# Patient Record
Sex: Female | Born: 1970 | Race: Black or African American | Hispanic: No | Smoking: Never smoker
Health system: Southern US, Community
[De-identification: ages and names within clinical notes are randomized; demographics above are authoritative.]

## PROBLEM LIST (undated history)

## (undated) DIAGNOSIS — Z87898 Personal history of other specified conditions: Secondary | ICD-10-CM

## (undated) DIAGNOSIS — N83201 Unspecified ovarian cyst, right side: Secondary | ICD-10-CM

## (undated) DIAGNOSIS — Z973 Presence of spectacles and contact lenses: Secondary | ICD-10-CM

## (undated) DIAGNOSIS — R350 Frequency of micturition: Secondary | ICD-10-CM

## (undated) DIAGNOSIS — D219 Benign neoplasm of connective and other soft tissue, unspecified: Secondary | ICD-10-CM

## (undated) DIAGNOSIS — R55 Syncope and collapse: Secondary | ICD-10-CM

## (undated) DIAGNOSIS — I10 Essential (primary) hypertension: Secondary | ICD-10-CM

## (undated) HISTORY — PX: NO PAST SURGERIES: SHX2092

## (undated) HISTORY — DX: Syncope and collapse: R55

---

## 2005-08-20 ENCOUNTER — Emergency Department (HOSPITAL_COMMUNITY): Admission: EM | Admit: 2005-08-20 | Discharge: 2005-08-20 | Payer: Self-pay | Admitting: Emergency Medicine

## 2006-10-09 IMAGING — CT CT HEAD W/O CM
1 series · 16 of 30 positions shown, 20 images · IV contrast (agent unspecified)
Comparison: None.

CLINICAL DATA: Syncope and generalized headache.  
 HEAD CT WITHOUT CONTRAST:
TECHNIQUE: Contiguous axial images were obtained from the base of the skull through the vertex according to standard protocol without contrast.

[Series 2: headseq 4.8 h45s · axial · 0.40mm/px · z∈[-162,-31]mm · 16 of 30 slices shown, 20 images]
[im 2/30  brain]
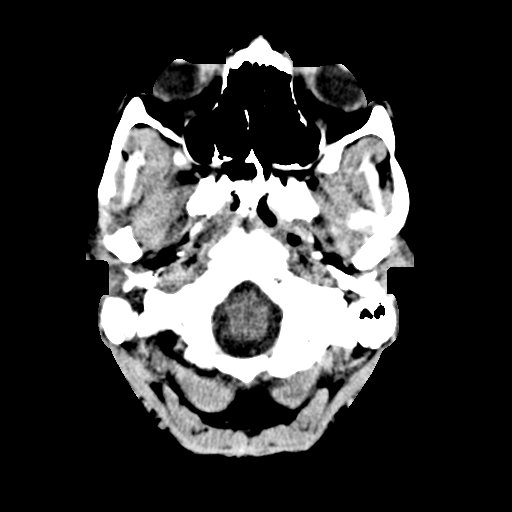
[im 2/30  bone]
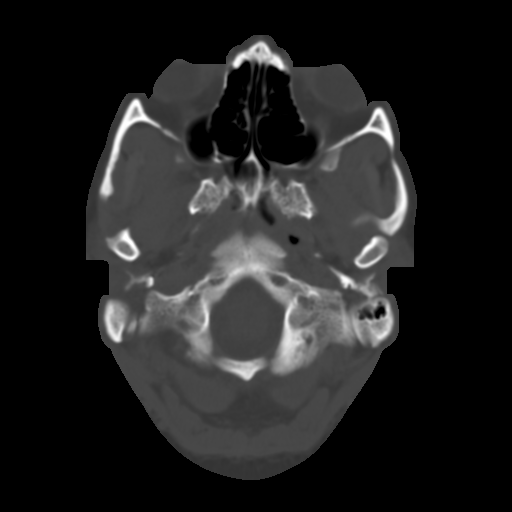
[im 4/30  brain]
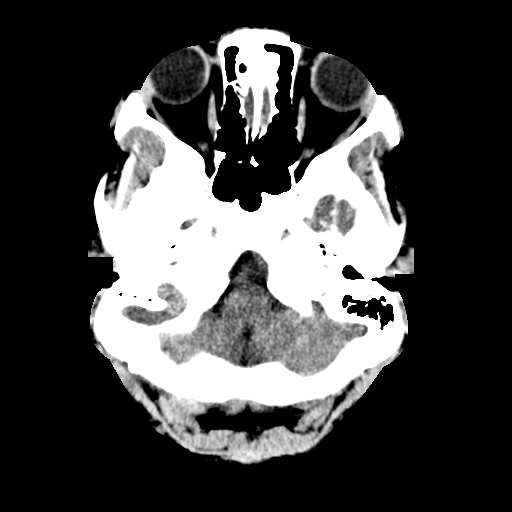
[im 6/30  brain]
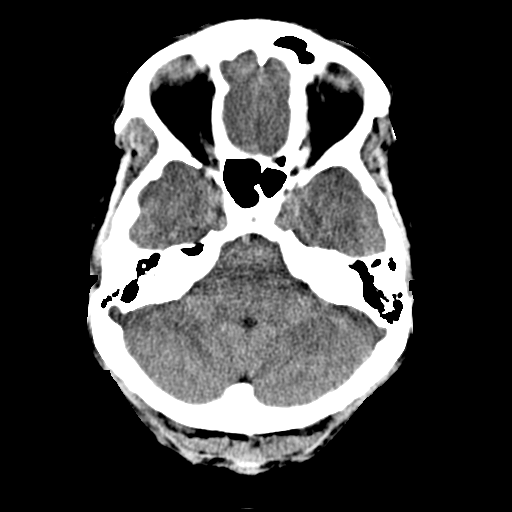
[im 8/30  brain]
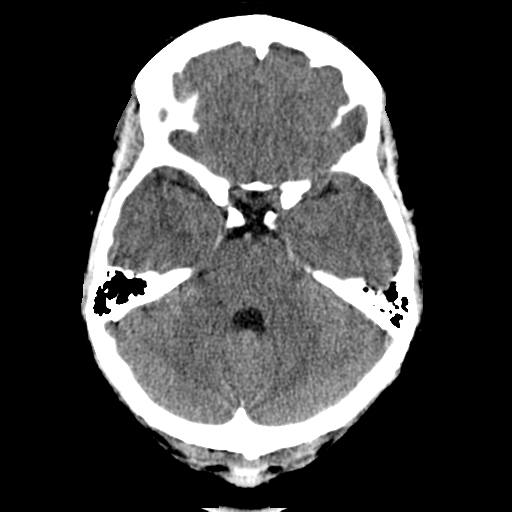
[im 9/30  brain]
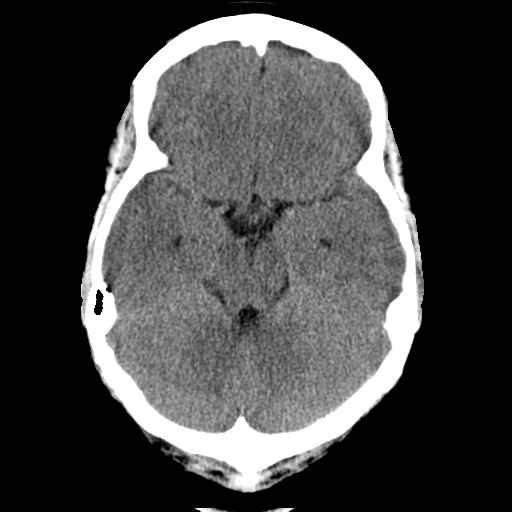
[im 9/30  bone]
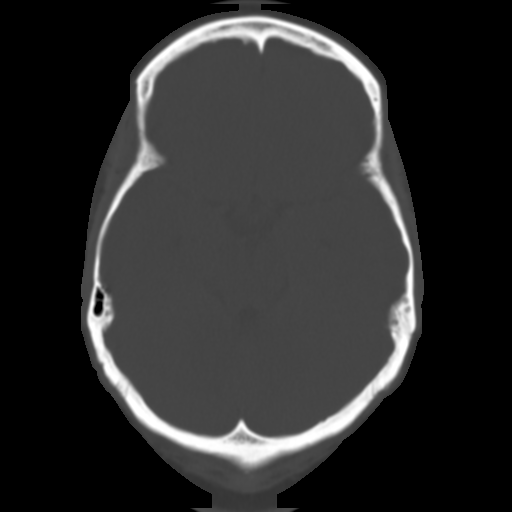
[im 11/30  brain]
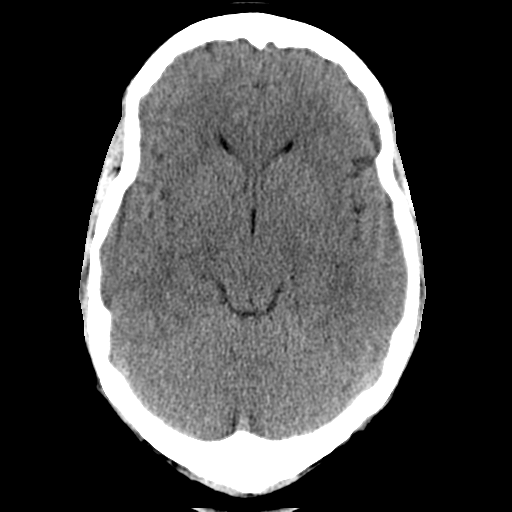
[im 13/30  brain]
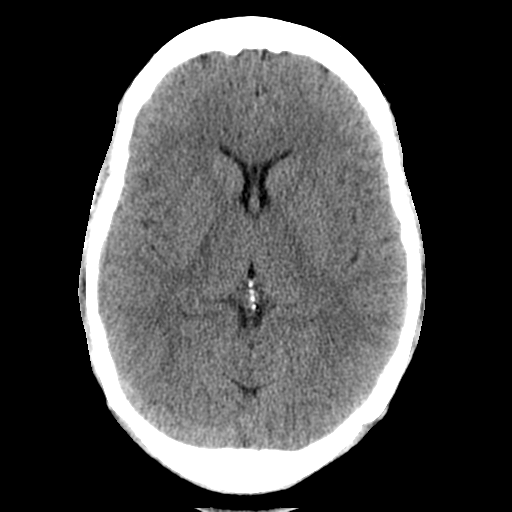
[im 15/30  brain]
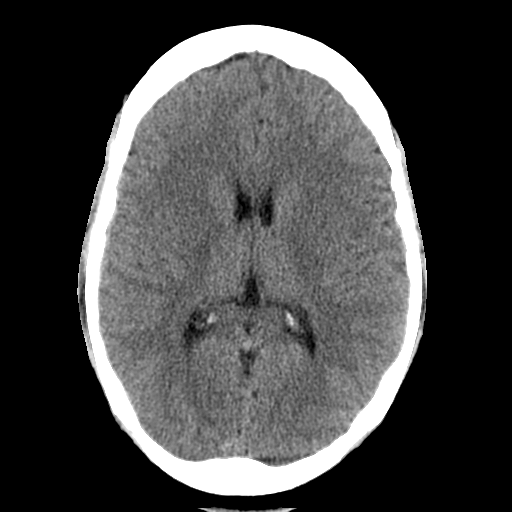
[im 16/30  brain]
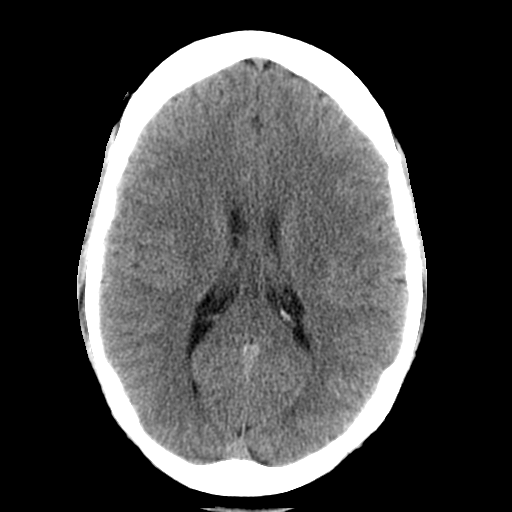
[im 16/30  bone]
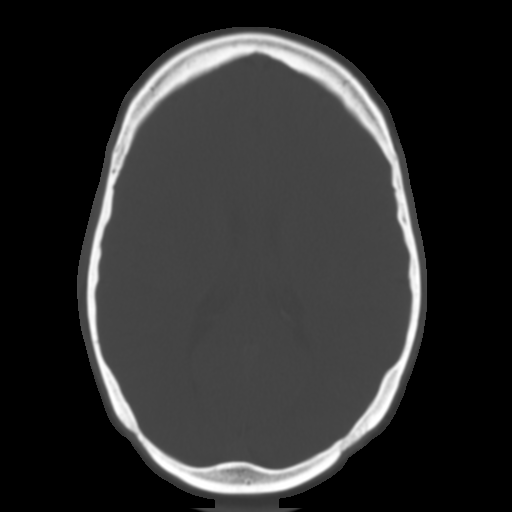
[im 18/30  brain]
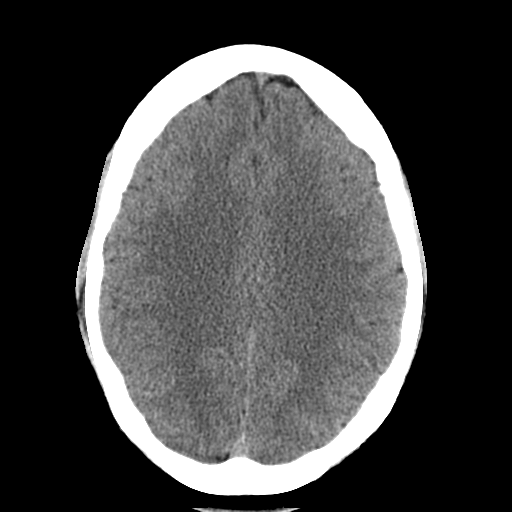
[im 20/30  brain]
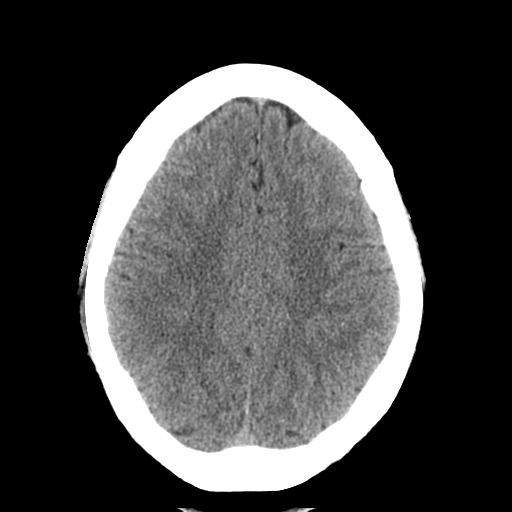
[im 22/30  brain]
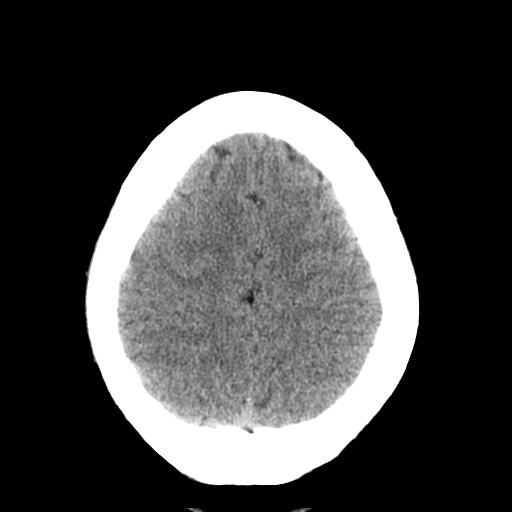
[im 23/30  brain]
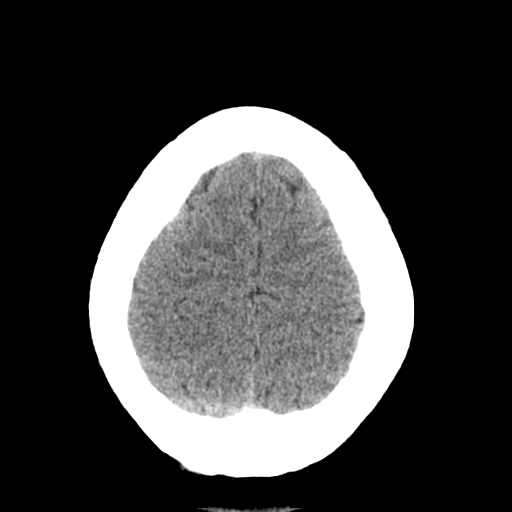
[im 23/30  bone]
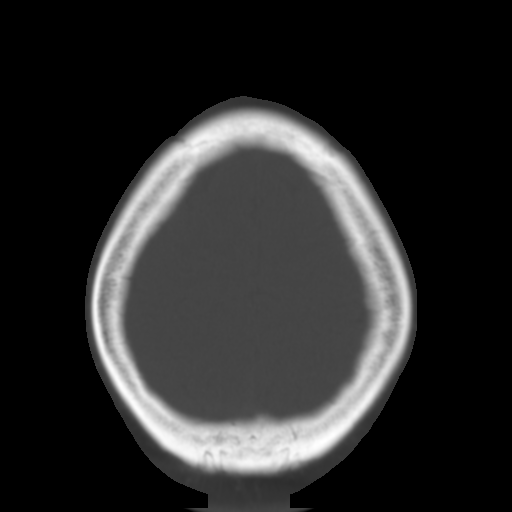
[im 25/30  brain]
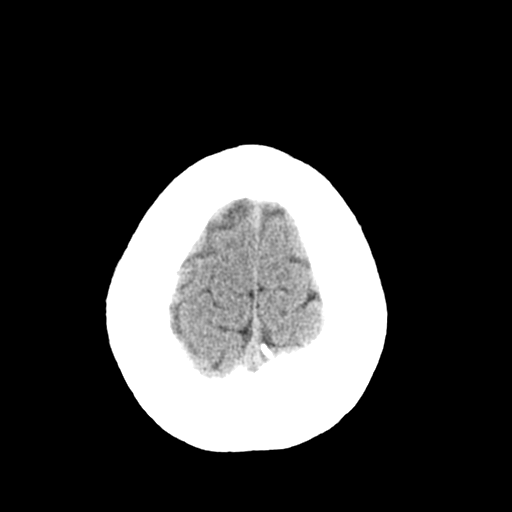
[im 27/30  brain]
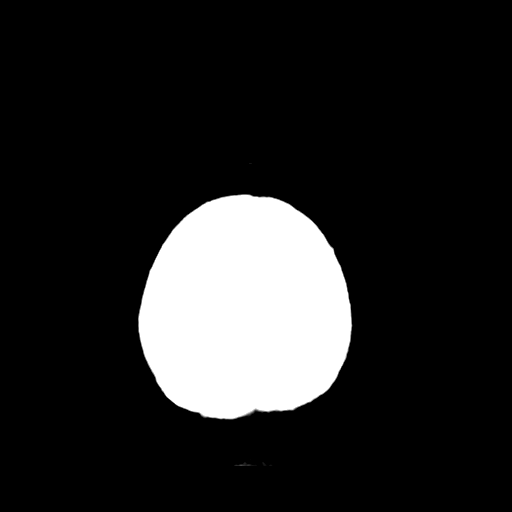
[im 29/30  brain]
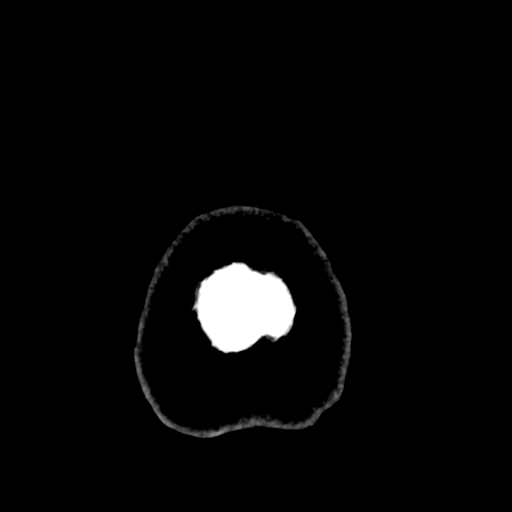

[16 of 30 positions shown; findings below may reference images not displayed]

FINDINGS: No CT evidence of acute infarct, hemorrhage, mass, mass effect, or hydrocephalus.  Visualized paranasal sinuses and mastoid air cells are clear.
IMPRESSION: No acute intracranial abnormality.

## 2013-10-24 ENCOUNTER — Emergency Department (HOSPITAL_COMMUNITY)
Admission: EM | Admit: 2013-10-24 | Discharge: 2013-10-24 | Disposition: A | Discharge status: Home / Self Care | Payer: Self-pay | Attending: Emergency Medicine | Admitting: Emergency Medicine

## 2013-10-24 DIAGNOSIS — I495 Sick sinus syndrome: Secondary | ICD-10-CM | POA: Insufficient documentation

## 2013-10-24 DIAGNOSIS — R42 Dizziness and giddiness: Secondary | ICD-10-CM | POA: Insufficient documentation

## 2013-10-24 DIAGNOSIS — R000 Tachycardia, unspecified: Secondary | ICD-10-CM

## 2013-10-24 DIAGNOSIS — R55 Syncope and collapse: Secondary | ICD-10-CM | POA: Insufficient documentation

## 2013-10-24 LAB — LIPASE, BLOOD: LIPASE: 36 U/L (ref 11–59)

## 2013-10-24 LAB — T4, FREE: Free T4: 1.34 ng/dL (ref 0.80–1.80)

## 2013-10-24 LAB — CBC WITH DIFFERENTIAL/PLATELET
BASOS ABS: 0 10*3/uL (ref 0.0–0.1)
BASOS PCT: 0 % (ref 0–1)
EOS ABS: 0.6 10*3/uL (ref 0.0–0.7)
Eosinophils Relative: 8 % — ABNORMAL HIGH (ref 0–5)
HEMATOCRIT: 38.7 % (ref 36.0–46.0)
Hemoglobin: 13.4 g/dL (ref 12.0–15.0)
Lymphocytes Relative: 36 % (ref 12–46)
Lymphs Abs: 2.5 10*3/uL (ref 0.7–4.0)
MCH: 29.2 pg (ref 26.0–34.0)
MCHC: 34.6 g/dL (ref 30.0–36.0)
MCV: 84.3 fL (ref 78.0–100.0)
MONO ABS: 0.4 10*3/uL (ref 0.1–1.0)
Monocytes Relative: 6 % (ref 3–12)
NEUTROS PCT: 50 % (ref 43–77)
Neutro Abs: 3.4 10*3/uL (ref 1.7–7.7)
Platelets: 144 10*3/uL — ABNORMAL LOW (ref 150–400)
RBC: 4.59 MIL/uL (ref 3.87–5.11)
RDW: 13.6 % (ref 11.5–15.5)
WBC: 6.9 10*3/uL (ref 4.0–10.5)

## 2013-10-24 LAB — COMPREHENSIVE METABOLIC PANEL
ALT: 11 U/L (ref 0–35)
AST: 19 U/L (ref 0–37)
Albumin: 4.1 g/dL (ref 3.5–5.2)
Alkaline Phosphatase: 51 U/L (ref 39–117)
BILIRUBIN TOTAL: 0.4 mg/dL (ref 0.3–1.2)
BUN: 6 mg/dL (ref 6–23)
CALCIUM: 9.2 mg/dL (ref 8.4–10.5)
CHLORIDE: 103 meq/L (ref 96–112)
CO2: 20 meq/L (ref 19–32)
Creatinine, Ser: 0.76 mg/dL (ref 0.50–1.10)
GFR calc Af Amer: >90 mL/min (ref >90)
Glucose, Bld: 129 mg/dL — ABNORMAL HIGH (ref 70–99)
POTASSIUM: 3.6 meq/L — AB (ref 3.7–5.3)
Sodium: 139 mEq/L (ref 137–147)
TOTAL PROTEIN: 8 g/dL (ref 6.0–8.3)

## 2013-10-24 LAB — RAPID URINE DRUG SCREEN, HOSP PERFORMED
AMPHETAMINES: NOT DETECTED
BENZODIAZEPINES: NOT DETECTED
Barbiturates: NOT DETECTED
Cocaine: NOT DETECTED
Opiates: NOT DETECTED
Tetrahydrocannabinol: NOT DETECTED

## 2013-10-24 LAB — TSH: TSH: 1.25 u[IU]/mL (ref 0.350–4.500)

## 2013-10-24 LAB — I-STAT TROPONIN, ED: Troponin i, poc: 0 ng/mL (ref 0.00–0.08)

## 2013-10-24 LAB — D-DIMER, QUANTITATIVE (NOT AT ARMC): D DIMER QUANT: 0.43 ug{FEU}/mL (ref 0.00–0.48)

## 2013-10-24 MED ORDER — SODIUM CHLORIDE 0.9 % IV BOLUS (SEPSIS)
1000.0000 mL | Freq: Once | INTRAVENOUS | Status: AC
  Administered 2013-10-24: 1000 mL via INTRAVENOUS

## 2013-10-24 MED ORDER — METOPROLOL TARTRATE 25 MG PO TABS
25.0000 mg | ORAL_TABLET | Freq: Once | ORAL | Status: AC
  Administered 2013-10-24: 25 mg via ORAL
  Filled 2013-10-24: qty 1

## 2013-10-24 MED ORDER — METOPROLOL TARTRATE 25 MG PO TABS: 25.0000 mg | ORAL_TABLET | Freq: Two times a day (BID) | ORAL | Status: DC

## 2013-10-24 NOTE — ED Notes (Signed)
Patient c/o new onset of feeling hot, lightheaded, stomach pain. MD Mingo Amber made aware.

## 2013-10-24 NOTE — ED Provider Notes (Signed)
CSN: [id number]     Arrival date & time 10/24/13  1601 History   First MD Initiated Contact with Patient 10/24/13 1620     Chief Complaint  Patient presents with  . Palpitations     (Consider location/radiation/quality/duration/timing/severity/associated sxs/prior Treatment) Patient is a 43 y.o. female presenting with palpitations. The history is provided by the patient.  Palpitations Palpitations quality:  Regular Onset quality:  Sudden Timing:  Sporadic Progression:  Resolved Chronicity:  New Context: caffeine (half of a coca-cola this morning)   Context: not anxiety, not appetite suppressants, not exercise, not hyperventilation, not illicit drugs, not nicotine and not stimulant use   Relieved by:  Bed rest Worsened by:  Nothing tried Associated symptoms: dizziness and near-syncope   Associated symptoms: no chest pain, no cough, no nausea, no numbness, no shortness of breath, no syncope and no vomiting     No past medical history on file.No medical problems No past surgical history on file.No prior surgeries  No family history on file. History  Substance Use Topics  . Smoking status: Not on file  . Smokeless tobacco: Not on file  . Alcohol Use: Not on file   OB History   No data available     Review of Systems  Constitutional: Negative for fever and chills.  Respiratory: Negative for cough and shortness of breath.   Cardiovascular: Positive for palpitations and near-syncope. Negative for chest pain, leg swelling and syncope.  Gastrointestinal: Positive for diarrhea (last week). Negative for nausea and vomiting.  Neurological: Positive for dizziness. Negative for numbness.  All other systems reviewed and are negative.      Allergies  Codeine  Home Medications   Current Outpatient Rx  Name  Route  Sig  Dispense  Refill  . Biotin 1000 MCG tablet   Oral   Take 1,000 mcg by mouth 3 (three) times daily.         . cetirizine (ZYRTEC) 10 MG tablet   Oral  Take 5 mg by mouth daily. 1/2 tablet         . Multiple Vitamins-Minerals (MULTIVITAMIN WITH MINERALS) tablet   Oral   Take 1 tablet by mouth daily.         . Sodium & Potassium Bicarbonate (ALKA-AID) 360-180 MG TABS   Oral   Take 1 tablet by mouth daily as needed (flu-like symptoms).          BP 152/72  Pulse 108  Temp(Src) 98.8 F (37.1 C) (Oral)  Resp 16 Physical Exam  Nursing note and vitals reviewed. Constitutional: She is oriented to person, place, and time. She appears well-developed and well-nourished. No distress.  HENT:  Head: Normocephalic and atraumatic.  Eyes: EOM are normal. Pupils are equal, round, and reactive to light.  Neck: Normal range of motion. Neck supple.  Cardiovascular: An irregular rhythm present. Tachycardia present.  Exam reveals no friction rub.   No murmur heard. Pulmonary/Chest: Effort normal and breath sounds normal. No respiratory distress. She has no wheezes. She has no rales.  Abdominal: Soft. She exhibits no distension. There is no tenderness. There is no rebound.  Musculoskeletal: Normal range of motion. She exhibits no edema.  Neurological: She is alert and oriented to person, place, and time.  Skin: She is not diaphoretic.    ED Course  Procedures (including critical care time) Labs Review Labs Reviewed  CBC WITH DIFFERENTIAL - Abnormal; Notable for the following:    Platelets 144 (*)    Eosinophils Relative 8 (*)  All other components within normal limits  COMPREHENSIVE METABOLIC PANEL  LIPASE, BLOOD  I-STAT TROPOININ, ED   Imaging Review No results found.   EKG Interpretation   Date/Time:  Monday October 24 2013 16:12:05 EDT Ventricular Rate:  116 PR Interval:  156 QRS Duration: 103 QT Interval:  319 QTC Calculation: 443 R Axis:   47 Text Interpretation:  Sinus tachycardia with irregular rate Probable left  atrial enlargement RSR' in V1 or V2, right VCD or RVH Morpholgy similar,  tachycardia new Confirmed by  Mingo Amber  MD, Ramiel Forti ([id number]) on 10/24/2013 4:20:54  PM      MDM   Final diagnoses:  Sinus tachycardia    43 year old female presents with palpitations. Was checking her e-mail and began for her heart racing. Felt associated dizziness. No nausea or chest pain. No fever or shortness of breath. Recent GI illness last week. She had caffeine this morning, half of a Coke. Not a normal caffeine user. No illicit drugs. No drug withdrawal or new drugs that she's been taking. In the room, she has sinus rhythm on the monitor with tachycardia noted. She has episodes of her heart rate going from the 90s up to the 120s at times. When asked if she felt a palpitation at that time, she does not report she feels it. No dizziness at this time. We'll check labs. EKG with sinus tachycardia. Will also give fluids Labs normal. Still having episodes of paroxysmal tachycardia. I spoke with Cards who stated this could be an ectopic atrial foci. Given Metoprolol BID per Cards and instructed on vagal maneuvers to help break it. Given resource guide for f/u.   Osvaldo Shipper, MD 10/25/13 [phone]

## 2013-10-24 NOTE — ED Notes (Signed)
Per EMS patient with Hx of stomach virus last week and recent new stress began feeling racing heart and generalized weakness starting 30 minutes ago, tachycardia at 130-150 bpm. Denies CP, SOB, n/v.

## 2013-10-24 NOTE — Discharge Instructions (Signed)
Cardiac Arrhythmia  Your heart is a muscle that works to pump blood through your body by regular contractions. The beating of your heart is controlled by a system of special pacemaker cells. These cells control the electrical activity of the heart. When the system controlling this regular beating is disturbed, a heart rhythm abnormality (arrhythmia) results.  WHEN YOUR HEART SKIPS A BEAT  One of the most common and least serious heart arrhythmias is called an ectopic or premature atrial heartbeat (PAC). This may be noticed as a small change in your regular pulse. A PAC originates from the top part (atrium) of the heart. Within the right atrium, the SA node is the area that normally controls the regularity of the heart. PACs occur in heart tissue outside of the SA node region. You may feel this as a skipped beat or heart flutter, especially if several occur in succession or occur frequently.   Another arrhythmia is ventricular premature complex (VCP or PVC). These extra beats start out in the bottom, more muscular chambers of the heart. In most cases a PVC is harmless. If there are underlying causes that are making the heart irritable such as an overactive thyroid or a prior heart attack PVCs may be of more concern. In a few cases, medications to control the heart rhythm may be prescribed.  Things to try at home:   Cut down or avoid alcohol, tobacco and caffeine.   Get enough sleep.   Reduce stress.   Exercise more.  WHEN THE HEART BEATS TOO FAST  Atrial tachycardia is a fast heart rate, which starts out in the atrium. It may last from minutes to much longer. Your heart may beat 140 to 240 times per minute instead of the normal 60 to 100.   Symptoms include a worried feeling (anxiety) and a sense that your heart is beating fast and hard.   You may be able to stop the fast rate by holding your breath or bearing down as if you were going to have a bowel movement.   This type of fast rate is usually not  dangerous.  Atrial fibrillation and atrial flutter are other fast rhythms that start in the atria. Both conditions keep the atria from filling with enough blood so the heart does not work well.   Symptoms include feeling light-headed or faint.   These fast rates may be the result of heart damage or disease. Too much thyroid hormone may play a role.   There may be no clear cause or it may be from heart disease or damage.   Medication or a special electrical treatment (cardioversion) may be needed to get the heart beating normally.  Ventricular tachycardia is a fast heart rate that starts in the lower muscular chambers (ventricles) This is a serious disorder that requires treatment as soon as possible. You need someone else to get and use a small defibrillator.   Symptoms include collapse, chest pain, or being short of breath.   Treatment may include medication, procedures to improve blood flow to the heart, or an implantable cardiac defibrillator (ICD).  DIAGNOSIS    A cardiogram (EKG or ECG) will be done to see the arrhythmia, as well as lab tests to check the underlying cause.   If the extra beats or fast rate come and go, you may wear a Holter monitor that records your heart rate for a longer period of time.  SEEK MEDICAL CARE IF:   You have irregular or fast heartbeats (palpitations).     you are already being treated. SEEK IMMEDIATE MEDICAL CARE IF:   You have severe chest pain, especially if the pain is crushing or pressure-like and spreads to the arms, back, neck, or jaw, or if you have sweating, feeling sick to your stomach (nausea), or shortness of breath. THIS IS AN EMERGENCY. Do not wait to see if the pain will go away. Get medical help at once. Call 911 or 0 (operator). DO NOT drive yourself to the hospital.  You feel dizzy or  faint.  You have episodes of previously documented atrial tachycardia that do not resolve with the techniques your caregiver has taught you.  Irregular or rapid heartbeats begin to occur more often than in the past, especially if they are associated with more pronounced symptoms or of longer duration. Document Released: 07/07/2005 Document Revised: 09/29/2011 Document Reviewed: 02/23/2008 [facility] Patient Information 2014 [place].   Emergency Department Resource Guide 1) Find a Doctor and Pay Out of Pocket Although you won't have to find out who is covered by your insurance plan, it is a good idea to ask around and get recommendations. You will then need to call the office and see if the doctor you have chosen will accept you as a new patient and what types of options they offer for patients who are self-pay. Some doctors offer discounts or will set up payment plans for their patients who do not have insurance, but you will need to ask so you aren't surprised when you get to your appointment.  2) Contact Your Local Health Department Not all health departments have doctors that can see patients for sick visits, but many do, so it is worth a call to see if yours does. If you don't know where your local health department is, you can check in your phone book. The CDC also has a tool to help you locate your state's health department, and many state websites also have listings of all of their local health departments.  3) Find a [facility] If your illness is not likely to be very severe or complicated, you may want to try a walk in clinic. These are popping up all over the country in pharmacies, drugstores, and shopping centers. They're usually staffed by nurse practitioners or physician assistants that have been trained to treat common illnesses and complaints. They're usually fairly quick and inexpensive. However, if you have serious medical issues or chronic medical problems, these are  probably not your best option.  No Primary Care Doctor: - Call Health Connect at  [phone] - they can help you locate a primary care doctor that  accepts your insurance, provides certain services, etc. - Physician Referral Service- [phone]  Chronic Pain Problems: Organization         Address  Phone   Notes  [facility]  [phone] Patients need to be referred by their primary care doctor.   Medication Assistance: Organization         Address  Phone   Notes  [facility] [address] [place], [place] [address] [phone] --Must be a resident of [facility] -- Must have NO insurance coverage whatsoever (no Medicaid/ Medicare, etc.) -- The pt. MUST have a primary care doctor that directs their care regularly and follows them in the community   [facility]  [phone]   [employer]  [phone]    Agencies that provide inexpensive medical care: Organization  Address  Phone   Notes  [facility]  [phone]   [facility]    [phone]   [facility] [place], [place] [address] [phone]   [place] [address], [place] [phone]   Planned Parenthood    [phone]   [facility]    [phone]   [place] and [place] [address], [place] Phone:  [phone], Fax:  [phone] Hours of Operation:  9 am - 6 pm, M-F.  Also accepts Medicaid/Medicare and self-pay.  [facility] for [address], [address], [place] Phone: [phone], Fax: [phone]. Hours of Operation:  8:30 am - 5:30 pm, M-F.  Also accepts Medicaid and self-pay.  [facility] [place] [address] [place] Phone: [phone]   [place], [place], [place] [phone], Ext. [phone] Mondays & Thursdays:  7-9 AM.  First 15 patients are seen on a first come, first serve basis.    [place] Providers:  Organization         Address  Phone   Notes  [facility] [address], [place] [phone] Also accepts self-pay patients.  [facility] [address], [place]  [phone]   [place], [address], [place] [phone]   [facility] [address], [place] [phone]   Lucianne Lei [address], [place]   [phone] Only accepts [place] Access [place] patients after they have their name applied to their card.   Self-Pay (no insurance) in [facility]:  Organization         Address  Phone   Notes  Sickle Cell Patients, [facility] [place] [phone]   [facility] [place] [phone]   [facility] [place]  [place], [place], [place] [phone]   [facility]/Dr. Osei-Bonsu  [address] [place] or [address], [place] [phone] Phone number for both [place] and [place] locations is the same.  [facility]gent [facility] and [facility] [address], [place] [phone]   [facility] [address], [place] or [address] [phone]   [facility] [address] [place] [phone], phone; [phone], fax Sees patients 1st and 3rd Saturday of every month.  Must not qualify for public or private insurance (i.e. Medicaid, Medicare, [place] Health Choice, Veterans' Benefits)  Household income should be no more than 200% of the poverty level The clinic cannot treat you if you are pregnant or think you are pregnant  Sexually transmitted diseases are not treated at the clinic.     Dental Care: Organization         Address  Phone  Notes  [facility] Department of [facility] [phone] Accepts children up to age 66 who are enrolled in [place] or [place]; pregnant women with a Medicaid card; and children who have applied for Medicaid or [place] Health Choice, but were declined, whose parents can pay a reduced fee at time  of service.  [facility] Department of [facility]  [address], [place] [phone] Accepts children up to age 36 who are enrolled in [place] or [place]; pregnant women with a Medicaid card; and children who have applied for Medicaid or [place] Health Choice, but were declined, whose parents can pay a reduced fee at time of service.  [place] Adult [facility] Access PRO[facility]  [place] [phone] Patients are seen by appointment only. Walk-ins are not accepted. [place] will see patients 58 years of age and older. Monday - Tuesday (8am-5pm) Most Wednesdays (8:30-5pm) $30 per visit, cash only  [facility] Adult Dental Access PROGRAM  [address], [facility] [phone] Patients are seen by appointment only. Walk-ins are not accepted. [facility] will see patients 78 years of age and older. One Wednesday Evening (Monthly: Volunteer Based).  $30 per visit, cash only  [place]  [phone] for adults; Children under age 20, call Graduate Pediatric Dentistry at [phone]. Children aged 64-14, please call [phone] to request a pediatric application.  Dental services are provided in all areas of dental care including fillings, crowns and bridges, complete and partial dentures, implants, gum treatment, root canals, and extractions. Preventive care is also provided. Treatment is provided to both adults and children. Patients are selected via a lottery and there is often a waiting  list.   [facility] [address] [place]  [phone] [email or web address]   [facility] [address] [place], [place] [phone], Ext. [phone] Second and Fourth Thursday of each month, opens at 6:30 AM; Clinic ends at 9 AM.  Patients are seen on a first-come first-served basis, and a limited number are seen during each clinic.   [facility]  [address] [place] Danker [place], [place] [phone]   Eligibility Requirements You must have lived in [place], [place], or [place] counties for at least the last three months.   You cannot be eligible for state or federal sponsored [employer], including [employer], [place], or [employer].   You generally cannot be eligible for healthcare insurance through your employer.    How to apply: Eligibility screenings are held every Tuesday and Wednesday afternoon from 1:00 pm until 4:00 pm. You do not need an appointment for the interview!  [facility] [address], [place], [place]   [place]  [place] Department  [place]  [phone]    Behavioral Health Resources in the Community: Intensive Outpatient Programs Organization         Address  Phone  Notes  [facility] [address] [place], [place] [phone]   [facility] Outpatient [address] [place], [place]   ADS: [facility] [address] [place], [place]   [place] [address],  [place], [place] or [phone]   Substance Abuse Resources Organization         Address  Phone  Notes  [facility]  [phone]   [place]  [phone]   [facility] [place]   Chinita Pester  [phone]   Residential & Outpatient Substance Abuse Program   [phone]   Psychological Services Organization         Address  Phone  Notes  °[facility]  336- [phone]   °[facility]  336- [phone]   °[facility] [address], [place] 1-800-853-5163 or [phone]   ° °Mobile Crisis Teams °Organization         Address  Phone  Notes  °[facility], Mobile Crisis Care Unit  1-877-626-1772   °Assertive °Psychotherapeutic Services ° [address] [place], [place] [phone]   °Sharon DeEsch [address] °[place] [phone]   ° °Self-Help/Support Groups °Organization         Address  Phone             Notes  °[facility] [place] - variety of support groups  336- 373-1402 Call for more information  °Narcotics Anonymous (NA), [facility] [address], °[place]  2 meetings at this location  ° °Residential Treatment Programs °Organization         Address  Phone  Notes  °[facility] [address],    °[place]  1-866-801-8205   °[facility] ° [address], [place], [place] [phone]   °[facility] [address], [place] [phone] Admissions: 8am-3pm M-F  °[facility] [address]    °[place], [place] [phone]   °[facility] [address], [place], [place] [phone]   °[facility] [address]  °[place], [place] [phone]   °[facility] - Intensive Outpatient [address], [place], [place] [phone]   °[facility] [address]  °[place], [place] 1-877-615-2722 or [phone]   °[facility] ([facility]) [address] [place], [place] [phone] Accepts Medicaid  °[facility] [address]  °[place] 1-800-659-3381 Substance Abuse/Addiction Treatment  ° °[place] Behavioral Health Resources °Organization          Address  Phone  Notes  °[facility]  (888) [phone]   °Julie Brannon, PhD [address] [place], [place]   (336) 349-5553 or (336) 951-0000   °[facility]   [address] °[place], [place] (336) 349-4454   °[facility] [address], [place], [place] (336) [phone] Insurance/Medicaid/sponsorship through [facility]  °[facility] [address]                                    [place], [place] (336) [phone] Therapy/tele-psych/case  °[facility] [address]  ° [place], [place] (336) 349-2233    °Dr. Arfeen  (336) 349-4544   °[facility] [place]  [employer] [place] [facility] 1) [address], [place] °2) [address], [place] °3)  [address], [place] (336) 349-3220 °(336) 342-7768 ° °(336) 342-8140   °[place] Child Abuse Hotline (336) 342-1394 or (336) 342-3537 (After Hours)    ° ° ° °

## 2013-10-24 NOTE — ED Notes (Signed)
Bed: OZ22 Expected date:  Expected time:  Means of arrival:  Comments: ems- 43 yo, tachycardia

## 2017-08-25 ENCOUNTER — Encounter: Payer: Self-pay | Admitting: Gynecology

## 2017-08-25 ENCOUNTER — Ambulatory Visit (INDEPENDENT_AMBULATORY_CARE_PROVIDER_SITE_OTHER): Payer: BC Managed Care – PPO | Admitting: Gynecology

## 2017-08-25 VITALS — BP 130/76 | Ht 66.0 in | Wt 206.0 lb

## 2017-08-25 DIAGNOSIS — N926 Irregular menstruation, unspecified: Secondary | ICD-10-CM | POA: Diagnosis not present

## 2017-08-25 DIAGNOSIS — Z01411 Encounter for gynecological examination (general) (routine) with abnormal findings: Secondary | ICD-10-CM | POA: Diagnosis not present

## 2017-08-25 DIAGNOSIS — D259 Leiomyoma of uterus, unspecified: Secondary | ICD-10-CM | POA: Diagnosis not present

## 2017-08-25 DIAGNOSIS — Z1151 Encounter for screening for human papillomavirus (HPV): Secondary | ICD-10-CM | POA: Diagnosis not present

## 2017-08-25 NOTE — Addendum Note (Signed)
Addended by: Nelva Nay on: 08/25/2017 04:48 PM   Modules accepted: Orders

## 2017-08-25 NOTE — Patient Instructions (Signed)
Follow-up for the ultrasound as scheduled   Call to Schedule your mammogram  Facilities in [place]: 1)  [facility]. [facility], [address] Phone: [phone] 2)  Dr. Bertrand at [facility]  [address] Phone: [phone]     Mammogram A mammogram is an X-ray test to find changes in a woman's breast. You should get a mammogram if:  You are 45 years of age or older  You have risk factors.   Your doctor recommends that you have one.  BEFORE THE TEST  Do not schedule the test the week before your period, especially if your breasts are sore during this time.  On the day of your mammogram:  Wash your breasts and armpits well. After washing, do not put on any deodorant or talcum powder on until after your test.   Eat and drink as you usually do.   Take your medicines as usual.   If you are diabetic and take insulin, make sure you:   Eat before coming for your test.   Take your insulin as usual.   If you cannot keep your appointment, call before the appointment to cancel. Schedule another appointment.  TEST  You will need to undress from the waist up. You will put on a hospital gown.   Your breast will be put on the mammogram machine, and it will press firmly on your breast with a piece of plastic called a compression paddle. This will make your breast flatter so that the machine can X-ray all parts of your breast.   Both breasts will be X-rayed. Each breast will be X-rayed from above and from the side. An X-ray might need to be taken again if the picture is not good enough.   The mammogram will last about 15 to 30 minutes.  AFTER THE TEST Finding out the results of your test Ask when your test results will be ready. Make sure you get your test results.  Document Released: 10/03/2008 Document Revised: 06/26/2011 Document Reviewed: 10/03/2008 [employer] Patient Information 2012 [employer].    

## 2017-08-25 NOTE — Progress Notes (Signed)
Shelly Carlson [birth year] [id number]        47 y.o.  G0P0000 new patient for annual gynecologic exam.  The patient notes that her menses have always been regular up until approximately 2 years ago where she went almost a year without bleeding.  Over the last year she had monthly menses until most recently when she started bleeding now which is 2 weeks early for her.  Notes some weight gain over the past year or 2.  No skin or hair changes.  No nipple discharge.  No hot flushes or night sweats.  Not using contraception although not actively trying to achieve pregnancy not protecting against it.  No history of abnormal Pap smears although it has been 6 years.  Has never had a mammogram.  Has a primary physician who does her routine blood work.  Past medical history,surgical history, problem list, medications, allergies, family history and social history were all reviewed and documented as reviewed in the EPIC chart.  ROS:  Performed with pertinent positives and negatives included in the history, assessment and plan.   Additional significant findings : None   Exam: [name] assistant Vitals:   08/25/17 1608  BP: 130/76  Weight: 206 lb (93.4 kg)  Height: 5\' 6"  (1.676 m)   Body mass index is 33.25 kg/m.  General appearance:  Normal affect, orientation and appearance. Skin: Grossly normal HEENT: Without gross lesions.  No cervical or supraclavicular adenopathy. Thyroid normal.  Lungs:  Clear without wheezing, rales or rhonchi Cardiac: RR, without RMG Abdominal:  Soft, nontender, without masses, guarding, rebound, organomegaly or hernia Breasts:  Examined lying and sitting without masses, retractions, discharge or axillary adenopathy. Pelvic:  Ext, BUS, Vagina: Normal with light menses flow  Cervix: Normal with light menses flow.  Pap smear done  Uterus: Bulky 16-18 weeks size midline mobile nontender  Adnexa: Unable to evaluate secondary to the bulk of the uterus  Anus and  perineum: Normal   Rectovaginal: Normal sphincter tone without palpated masses or tenderness.    Assessment/Plan:  47 y.o. G0P0000 female for annual gynecologic exam with irregular menses, no contraception.   1. Irregular menses/enlarged uterus consistent with leiomyoma.  Patient has never been told she had fibroids in the past.  Uterus is markedly enlarged.  Also with irregular menses although over the past year have been more regular.  Will check baseline labs to include CBC, FSH TSH prolactin and hCG.  Recommend sonohysterogram to better define uterus and rule out ovarian process as well as endometrial defects.  Will allow for endometrial sampling if needed.  Patient will schedule in follow-up for this.  We discussed various scenarios to include hormonal/medication treatments up to and including surgery such as myomectomy or hysterectomy.  Patient will follow-up for her ultrasound then will go from there. 2. Contraception.  Patient not interested in contraception.  Would accept pregnancy if recurs. 3. Never had mammogram.  Most common cancer in women reviewed.  Need to schedule baseline screening mammogram stressed and she agrees to call and schedule.  Names and numbers provided.  Breast exam normal today. 4. Pap smear 6 years ago.  Pap smear/HPV today.  No history of abnormal Pap smears previously. 5. Health maintenance.  No routine lab work done as patient does this elsewhere.  Follow-up for lab results and sonohysterogram.  Additional time in excess of her routine gynecologic exam was spent in direct face to face counseling and coordination of care in regards to her irregular menses and  enlarged uterus.    Anastasio Auerbach MD, 4:32 PM 08/25/2017

## 2017-08-26 ENCOUNTER — Other Ambulatory Visit: Payer: Self-pay | Admitting: Gynecology

## 2017-08-26 DIAGNOSIS — N939 Abnormal uterine and vaginal bleeding, unspecified: Secondary | ICD-10-CM

## 2017-08-26 DIAGNOSIS — N852 Hypertrophy of uterus: Secondary | ICD-10-CM

## 2017-08-28 LAB — PAP IG AND HPV HIGH-RISK: HPV DNA High Risk: NOT DETECTED

## 2017-08-31 ENCOUNTER — Ambulatory Visit (INDEPENDENT_AMBULATORY_CARE_PROVIDER_SITE_OTHER): Payer: BC Managed Care – PPO

## 2017-08-31 ENCOUNTER — Encounter: Payer: Self-pay | Admitting: Gynecology

## 2017-08-31 ENCOUNTER — Other Ambulatory Visit: Payer: Self-pay | Admitting: Gynecology

## 2017-08-31 ENCOUNTER — Ambulatory Visit (INDEPENDENT_AMBULATORY_CARE_PROVIDER_SITE_OTHER): Payer: BC Managed Care – PPO | Admitting: Gynecology

## 2017-08-31 VITALS — BP 122/78

## 2017-08-31 DIAGNOSIS — N852 Hypertrophy of uterus: Secondary | ICD-10-CM | POA: Diagnosis not present

## 2017-08-31 DIAGNOSIS — D259 Leiomyoma of uterus, unspecified: Secondary | ICD-10-CM

## 2017-08-31 DIAGNOSIS — N939 Abnormal uterine and vaginal bleeding, unspecified: Secondary | ICD-10-CM | POA: Diagnosis not present

## 2017-08-31 DIAGNOSIS — N838 Other noninflammatory disorders of ovary, fallopian tube and broad ligament: Secondary | ICD-10-CM

## 2017-08-31 DIAGNOSIS — N926 Irregular menstruation, unspecified: Secondary | ICD-10-CM

## 2017-08-31 DIAGNOSIS — N839 Noninflammatory disorder of ovary, fallopian tube and broad ligament, unspecified: Secondary | ICD-10-CM | POA: Diagnosis not present

## 2017-08-31 DIAGNOSIS — N83201 Unspecified ovarian cyst, right side: Secondary | ICD-10-CM

## 2017-08-31 LAB — PREGNANCY, URINE: PREG TEST UR: NEGATIVE

## 2017-08-31 NOTE — Progress Notes (Addendum)
    Shelly Carlson [birth year] [id number]        46 y.o.  G0P0000 presents for sonohysterogram.  History of irregular menses and enlarged uterus on exam.  UPT negative today  Past medical history,surgical history, problem list, medications, allergies, family history and social history were all reviewed and documented in the EPIC chart.  Directed ROS with pertinent positives and negatives documented in the history of present illness/assessment and plan.  Exam: [place] assistant Blood pressure 122/78 General appearance:  Normal Abdomen soft nontender with palpable mass below the umbilicus. Pelvic external BUS vagina normal.  Cervix normal.  Uterus enlarged to 16-18 weeks size.  Adnexa unable to evaluate.  Ultrasound transvaginal and transabdominal shows uterus enlarged with large single anterior myoma 12 x 7.5 x 11.5 cm.  Smaller posterior myoma 28 x 25 mm.  Endometrial echo 5.5 mm.  Right ovary with thin-walled cyst with diffuse low-level internal echoes 63 x 64 x 53 mm.  60 mm mean.  Thin septum noted.  Negative color flow Doppler.  Left ovary not visualized.  Cul-de-sac negative.  Sonohysterogram performed, sterile technique with catheter introduced into lower uterine segment subsequently obstructed by the leiomyoma and unable to negotiate into the endometrial cavity.  Tried a separate [place] but again the myoma was obstructing the cavity.  Endometrial cavity it did distend with no abnormality seen.  Sample was taken from the lower uterine segment.  Patient tolerated well.  Assessment/Plan:  47 y.o. G0P0000 with ultrasound showing large single myoma with probable smaller ones accounting for uterine enlargement.  Endometrial echo relatively thin although could not distend mid to upper endometrial cavity with sonohysterogram due to obstruction from the large myoma.  Sample from the lower uterine segment was taken and will follow-up for these results.  Had ordered blood work previously but the  patient apparently did not go by the lab to have her baseline hormone studies done.  We will go ahead with TSH FSH prolactin today.  Has 6 cm right ovarian cyst with echogenic low-level echoes.  Physiologic/hemorrhagic versus pathologic differential up to and including ovarian cancer were reviewed with the patient.  Will check baseline CA 125.  Issues of false positive versus false negative discussed.  Ultimate management options reviewed to include if normal CA 125 expectant management with follow-up ultrasound in 2-3 months versus interventional depending on what we do with her leiomyoma also reviewed.  Options for myoma management discussed to include expectant, medication trials such as progesterone/Depo-Lupron, uterine artery embolization, myomectomy, hysterectomy.  The issues of fertility and her desires for the future also discussed.  Patient wants to think of all of these issues and will follow-up for lab test results and then will go from there.    Anastasio Auerbach MD, 9:07 AM 08/31/2017

## 2017-08-31 NOTE — Patient Instructions (Signed)
Follow-up for lab test results and ultimate decision about management

## 2017-09-01 LAB — CA 125: CA 125: 38 U/mL — AB (ref <35)

## 2017-09-01 LAB — PROLACTIN: Prolactin: 14.6 ng/mL

## 2017-09-01 LAB — FOLLICLE STIMULATING HORMONE: FSH: 10.2 m[IU]/mL

## 2017-09-01 LAB — TSH: TSH: 2.18 m[IU]/L

## 2017-09-18 ENCOUNTER — Ambulatory Visit (INDEPENDENT_AMBULATORY_CARE_PROVIDER_SITE_OTHER): Payer: BC Managed Care – PPO | Admitting: Gynecology

## 2017-09-18 ENCOUNTER — Encounter: Payer: Self-pay | Admitting: Gynecology

## 2017-09-18 VITALS — BP 120/76

## 2017-09-18 DIAGNOSIS — D259 Leiomyoma of uterus, unspecified: Secondary | ICD-10-CM | POA: Diagnosis not present

## 2017-09-18 DIAGNOSIS — N83201 Unspecified ovarian cyst, right side: Secondary | ICD-10-CM | POA: Diagnosis not present

## 2017-09-18 DIAGNOSIS — R971 Elevated cancer antigen 125 [CA 125]: Secondary | ICD-10-CM | POA: Diagnosis not present

## 2017-09-18 DIAGNOSIS — N926 Irregular menstruation, unspecified: Secondary | ICD-10-CM

## 2017-09-18 NOTE — Patient Instructions (Signed)
Follow-up with your decision as far as management

## 2017-09-18 NOTE — Progress Notes (Signed)
    Shelly Carlson 04-23-1971 [id number]        46 y.o.  G0P0000 presents with her mother to discuss her current situation.  Initially presented with some irregular menses and enlarged uterus on pelvic exam measuring approximately 16 weeks size.  She was not having significant abdominal discomfort.  Ultrasound confirmed enlarged uterus with single anterior myoma 12 x 11.5 x 7.5 cm.  A smaller posterior myoma 28 x 25 mm was noted.  Right ovary showed a thin-walled cyst with diffuse low-level echoes 63 x 64 x 53 mm.  Negative color flow.  Appearance suggestive of an endometrioma.  Left ovary was not visualized.  Sonohysterogram was performed without gross evidence of abnormalities in the cavity but was unable to negotiate into the upper uterine cavity with the catheter due to obstruction from the leiomyoma.  The endometrial biopsy showed proliferative endometrium and some vascular changes to suggest possible endometrial polyp.  No hyperplasia or atypia seen.  Mother does give a history of hysterectomy due to leiomyoma and endometriosis.  Past medical history,surgical history, problem list, medications, allergies, family history and social history were all reviewed and documented in the EPIC chart.  Directed ROS with pertinent positives and negatives documented in the history of present illness/assessment and plan.  Exam: Vitals:   09/18/17 1504  BP: 120/76   General appearance:  Normal   Assessment/Plan:  47 y.o. G0P0000 with history as above.  2 separate issues were reviewed: Right ovarian cyst suggesting an endometrioma with minimally elevated CA 125.  Second issue is enlarged uterus with large myoma 12 cm and a smaller myoma 28 mm.  Patient is nulliparous but does desire to maintain fertility.  Options for management include observation with patient accepting cannot rule out malignancy and possibilities of situation worsening over time as far as enlarging myomas or worsening endometriosis.  Second  option is surgery from a conservative standpoint myomectomy and ovarian cystectomy/possible oophorectomy versus definitive surgery to include hysterectomy with or without bilateral salpingo-oophorectomy.  The issues of large myoma and risks of pregnancy if left intact to include infertility, spontaneous abortions or early loss due to preterm labor versus myomectomy now before pregnancy trial to enhance success of pregnancy but excepting risks to include scarring leading to infertility and scarring leading to uterine rupture either before or during labor.  Also reviewed the endometrial biopsy showing proliferative endometrium with some vascularity to suggest polyp and whether she truly has an endometrial polyp or not although not visualized as to whether this is an over read by the pathologist.  Lastly we discussed approaches and the possibility of robotic versus open with the pros and cons of each choice discussed to include potential for significant adhesions with endometriosis and difficulty from a technical standpoint given the size of the uterus and cyst.  Patient wants to process all of this information and discussed this with her mother and will follow up with her ultimate decision.  If she does decide on expectant management again she accepts the risks of worsening disease as well as malignancy and I did recommend repeating the ultrasound in 2-3 months to relook at these ovary and myoma just to make sure things are overall stable.  Greater than 50% of my 25 minute office visit was spent in direct face to face counseling and coordination of care with the patient and her mother.    Anastasio Auerbach MD, 4:11 PM 09/18/2017

## 2017-09-21 ENCOUNTER — Other Ambulatory Visit: Payer: Self-pay | Admitting: *Deleted

## 2017-09-21 DIAGNOSIS — D259 Leiomyoma of uterus, unspecified: Secondary | ICD-10-CM

## 2018-01-04 ENCOUNTER — Other Ambulatory Visit: Payer: Self-pay | Admitting: Gynecology

## 2018-01-04 ENCOUNTER — Ambulatory Visit (INDEPENDENT_AMBULATORY_CARE_PROVIDER_SITE_OTHER): Payer: BC Managed Care – PPO

## 2018-01-04 ENCOUNTER — Encounter: Payer: Self-pay | Admitting: Gynecology

## 2018-01-04 ENCOUNTER — Ambulatory Visit: Payer: BC Managed Care – PPO | Admitting: Gynecology

## 2018-01-04 VITALS — BP 118/76

## 2018-01-04 DIAGNOSIS — N83201 Unspecified ovarian cyst, right side: Secondary | ICD-10-CM | POA: Diagnosis not present

## 2018-01-04 DIAGNOSIS — N852 Hypertrophy of uterus: Secondary | ICD-10-CM | POA: Diagnosis not present

## 2018-01-04 DIAGNOSIS — D251 Intramural leiomyoma of uterus: Secondary | ICD-10-CM

## 2018-01-04 DIAGNOSIS — D259 Leiomyoma of uterus, unspecified: Secondary | ICD-10-CM | POA: Diagnosis not present

## 2018-01-04 NOTE — Progress Notes (Signed)
    Shelly Carlson 09-Aug-1970 [id number]        47 y.o.  G0P0000 presents with her mother for follow-up ultrasound.  History of irregular bleeding with initial ultrasound 08/2017 showed an enlarged uterus with large myoma and 6 cm right ovarian cyst consistent with endometrioma.  Initial biopsy showed proliferative endometrium with some question of endometrial polyp and a CA125 of 38.  We discussed a variety of options as noted in my 08/31/2017 and 09/18/2017 notes.  Patient wants to proceed with surgery to include myomectomy and ovarian cystectomy.  Past medical history,surgical history, problem list, medications, allergies, family history and social history were all reviewed and documented in the EPIC chart.  Directed ROS with pertinent positives and negatives documented in the history of present illness/assessment and plan.  Exam: Vitals:   01/04/18 1031  BP: 118/76   General appearance:  Normal Abdomen with palpable abdominopelvic mass at umbilicus.  Ultrasound transvaginal and transabdominal shows uterus enlarged with large myoma 14 x 7.8 x 14 cm.  Smaller myoma 27 x 28 mm noted.  Endometrial echo not clearly visualized.  Right ovary with rim of ovarian tissue and thick walled cystic mass low level internal echoes 62 x 45 x 56 mm.  Negative color flow Doppler.  Left ovary not visualized.  Cul-de-sac negative.  Assessment/Plan:  47 y.o. G0P0000 with large myoma and right ovarian cyst suspicious for endometrioma.  CA 125 38 which goes along with this.  We discussed options from conservative observation through myomectomy and ovarian cystectomy to hysterectomy with or without BSO.  The patient is nulliparous and at this point wants to maintain fertility.  She understands with myomectomy and retention of her uterus she is at risk of regrowth of myomas in the future.  We also discussed the right ovarian cyst and if endometriosis and significant adhesions/scarring that a cystectomy may not be possible  and a salpingo-oophorectomy on the right may be necessary.  The impact upon fertility was reviewed.  At this point the patient wants to proceed with the procedure ahead and move towards scheduling.  We discussed in general the large incision and transfusion risks associated with myomectomies.  She has no barrier to transfusions if needed.  We will go ahead and check baseline CBC today.  She will follow-up for a preoperative consult before surgery.    Anastasio Auerbach MD, 10:57 AM 01/04/2018

## 2018-01-04 NOTE — Patient Instructions (Signed)
Office will call to arrange for surgery.

## 2018-01-06 ENCOUNTER — Encounter: Payer: Self-pay | Admitting: Gynecology

## 2018-01-07 ENCOUNTER — Other Ambulatory Visit: Payer: BC Managed Care – PPO

## 2018-01-07 LAB — CBC
HCT: 43.8 % (ref 35.0–45.0)
Hemoglobin: 14.8 g/dL (ref 11.7–15.5)
MCH: 30.3 pg (ref 27.0–33.0)
MCHC: 33.8 g/dL (ref 32.0–36.0)
MCV: 89.8 fL (ref 80.0–100.0)
MPV: 15 fL — AB (ref 7.5–12.5)
PLATELETS: 126 10*3/uL — AB (ref 140–400)
RBC: 4.88 10*6/uL (ref 3.80–5.10)
RDW: 13 % (ref 11.0–15.0)
WBC: 6.9 10*3/uL (ref 3.8–10.8)

## 2018-01-15 NOTE — Patient Instructions (Addendum)
Shelly Carlson  Your procedure is scheduled on  ___07-08-2019_________  Report to [facility] AT  5:30 A. M._____   Call this number if you have problems the morning of surgery  :[phone].   OUR ADDRESS IS [place].  WE ARE LOCATED IN [facility].                                     REMEMBER:  DO NOT EAT FOOD OR DRINK LIQUIDS AFTER MIDNIGHT .   TAKE THESE MEDICATIONS MORNING OF SURGERY WITH A SIP OF WATER:  NONE                                    DO NOT WEAR JEWERLY, MAKE UP, OR NAIL POLISH,  DO NOT WEAR LOTIONS, POWDERS, PERFUMES OR DEODORANT. DO NOT SHAVE FOR 24 HOURS PRIOR TO DAY OF SURGERY.  CONTACTS, GLASSES, OR DENTURES MAY NOT BE WORN TO SURGERY.                                    [facility] IS NOT RESPONSIBLE  FOR ANY BELONGINGS.                                                                    Marland Kitchen      REMEMBER TO [facility] MEDICATIONS IN PRESCRIPTION BOTTLES                                                              [place] - Preparing for Surgery Before surgery, you can play an important role.  Because skin is not sterile, your skin needs to be as free of germs as possible.  You can reduce the number of germs on your skin by washing with CHG (chlorahexidine gluconate) soap before surgery.  CHG is an antiseptic cleaner which kills germs and bonds with the skin to continue killing germs even after washing. Please DO NOT use if you have an allergy to CHG or antibacterial soaps.  If your skin becomes reddened/irritated stop using the CHG and inform your nurse when you arrive at Short Stay. Do not shave (including legs and underarms) for at least 48 hours prior to the first CHG shower.  You may shave your face/neck. Please follow these instructions carefully:  1.  Shower with CHG Soap the night before surgery and the  morning of Surgery.  2.  If you choose to wash your hair, wash your hair first as  usual with your  normal  shampoo.  3.  After you shampoo, rinse your hair and body thoroughly to remove the  shampoo.  4.  Use CHG as you would any other liquid soap.  You can apply chg directly  to the skin and wash                       Gently with a scrungie or clean washcloth.  5.  Apply the CHG Soap to your body ONLY FROM THE NECK DOWN.   Do not use on face/ open                           Wound or open sores. Avoid contact with eyes, ears mouth and genitals (private parts).                       Wash face,  Genitals (private parts) with your normal soap.             6.  Wash thoroughly, paying special attention to the area where your surgery  will be performed.  7.  Thoroughly rinse your body with warm water from the neck down.  8.  DO NOT shower/wash with your normal soap after using and rinsing off  the CHG Soap.             9.  Pat yourself dry with a clean towel.            10.  Wear clean pajamas.            11.  Place clean sheets on your bed the night of your first shower and do not  sleep with pets. Day of Surgery : Do not apply any lotions/deodorants the morning of surgery.  Please wear clean clothes to the hospital/surgery center.  FAILURE TO FOLLOW THESE INSTRUCTIONS MAY RESULT IN THE CANCELLATION OF YOUR SURGERY PATIENT SIGNATURE_________________________________  NURSE SIGNATURE__________________________________  ________________________________________________________________________  WHAT IS A BLOOD TRANSFUSION? Blood Transfusion Information  A transfusion is the replacement of blood or some of its parts. Blood is made up of multiple cells which provide different functions.  Red blood cells carry oxygen and are used for blood loss replacement.  White blood cells fight against infection.  Platelets control bleeding.  Plasma helps clot blood.  Other blood products are available for specialized needs, such as hemophilia or other  clotting disorders. BEFORE THE TRANSFUSION  Who gives blood for transfusions?   Healthy volunteers who are fully evaluated to make sure their blood is safe. This is blood bank blood. Transfusion therapy is the safest it has ever been in the practice of medicine. Before blood is taken from a donor, a complete history is taken to make sure that person has no history of diseases nor engages in risky social behavior (examples are intravenous drug use or sexual activity with multiple partners). The donor's travel history is screened to minimize risk of transmitting infections, such as malaria. The donated blood is tested for signs of infectious diseases, such as HIV and hepatitis. The blood is then tested to be sure it is compatible with you in order to minimize the chance of a transfusion reaction. If you or a relative donates blood, this is often done in anticipation of surgery and is not appropriate for emergency situations. It takes many days to process the donated blood. RISKS AND COMPLICATIONS Although transfusion therapy is very safe and saves many lives, the main dangers of transfusion include:   Getting an infectious disease.  Developing a transfusion reaction. This is an allergic  reaction to something in the blood you were given. Every precaution is taken to prevent this. The decision to have a blood transfusion has been considered carefully by your caregiver before blood is given. Blood is not given unless the benefits outweigh the risks. AFTER THE TRANSFUSION  Right after receiving a blood transfusion, you will usually feel much better and more energetic. This is especially true if your red blood cells have gotten low (anemic). The transfusion raises the level of the red blood cells which carry oxygen, and this usually causes an energy increase.  The nurse administering the transfusion will monitor you carefully for complications. HOME CARE INSTRUCTIONS  No special instructions are needed  after a transfusion. You may find your energy is better. Speak with your caregiver about any limitations on activity for underlying diseases you may have. SEEK MEDICAL CARE IF:   Your condition is not improving after your transfusion.  You develop redness or irritation at the intravenous (IV) site. SEEK IMMEDIATE MEDICAL CARE IF:  Any of the following symptoms occur over the next 12 hours:  Shaking chills.  You have a temperature by mouth above 102 F (38.9 C), not controlled by medicine.  Chest, back, or muscle pain.  People around you feel you are not acting correctly or are confused.  Shortness of breath or difficulty breathing.  Dizziness and fainting.  You get a rash or develop hives.  You have a decrease in urine output.  Your urine turns a dark color or changes to pink, red, or brown. Any of the following symptoms occur over the next 10 days:  You have a temperature by mouth above 102 F (38.9 C), not controlled by medicine.  Shortness of breath.  Weakness after normal activity.  The white part of the eye turns yellow (jaundice).  You have a decrease in the amount of urine or are urinating less often.  Your urine turns a dark color or changes to pink, red, or brown. Document Released: 07/04/2000 Document Revised: 09/29/2011 Document Reviewed: 02/21/2008 [facility] Patient Information 2014 [place], [place].  _______________________________________________________________________

## 2018-01-18 ENCOUNTER — Inpatient Hospital Stay (HOSPITAL_COMMUNITY): Admission: RE | Admit: 2018-01-18 | Payer: Self-pay | Source: Ambulatory Visit

## 2018-01-18 ENCOUNTER — Encounter (HOSPITAL_COMMUNITY): Payer: Self-pay

## 2018-01-18 ENCOUNTER — Other Ambulatory Visit: Payer: Self-pay

## 2018-01-18 ENCOUNTER — Encounter (HOSPITAL_COMMUNITY)
Admission: RE | Admit: 2018-01-18 | Discharge: 2018-01-18 | Disposition: A | Discharge status: Home / Self Care | Payer: BC Managed Care – PPO | Source: Ambulatory Visit | Attending: Gynecology | Admitting: Gynecology

## 2018-01-18 DIAGNOSIS — N83201 Unspecified ovarian cyst, right side: Secondary | ICD-10-CM | POA: Insufficient documentation

## 2018-01-18 DIAGNOSIS — D259 Leiomyoma of uterus, unspecified: Secondary | ICD-10-CM | POA: Diagnosis not present

## 2018-01-18 DIAGNOSIS — Z01812 Encounter for preprocedural laboratory examination: Secondary | ICD-10-CM | POA: Diagnosis present

## 2018-01-18 DIAGNOSIS — Z01810 Encounter for preprocedural cardiovascular examination: Secondary | ICD-10-CM | POA: Insufficient documentation

## 2018-01-18 HISTORY — DX: Personal history of other specified conditions: Z87.898

## 2018-01-18 HISTORY — DX: Unspecified ovarian cyst, right side: N83.201

## 2018-01-18 HISTORY — DX: Frequency of micturition: R35.0

## 2018-01-18 HISTORY — DX: Essential (primary) hypertension: I10

## 2018-01-18 HISTORY — DX: Benign neoplasm of connective and other soft tissue, unspecified: D21.9

## 2018-01-18 HISTORY — DX: Presence of spectacles and contact lenses: Z97.3

## 2018-01-18 LAB — COMPREHENSIVE METABOLIC PANEL
ALBUMIN: 3.9 g/dL (ref 3.5–5.0)
ALT: 16 U/L (ref 0–44)
AST: 23 U/L (ref 15–41)
Alkaline Phosphatase: 51 U/L (ref 38–126)
Anion gap: 8 (ref 5–15)
BILIRUBIN TOTAL: 0.6 mg/dL (ref 0.3–1.2)
BUN: 8 mg/dL (ref 6–20)
CHLORIDE: 103 mmol/L (ref 98–111)
CO2: 29 mmol/L (ref 22–32)
Calcium: 9.3 mg/dL (ref 8.9–10.3)
Creatinine, Ser: 0.84 mg/dL (ref 0.44–1.00)
GFR calc Af Amer: >60 mL/min (ref >60)
GFR calc non Af Amer: >60 mL/min (ref >60)
GLUCOSE: 91 mg/dL (ref 70–99)
POTASSIUM: 4.7 mmol/L (ref 3.5–5.1)
Sodium: 140 mmol/L (ref 135–145)
TOTAL PROTEIN: 7.8 g/dL (ref 6.5–8.1)

## 2018-01-18 LAB — CBC
HEMATOCRIT: 42.4 % (ref 36.0–46.0)
Hemoglobin: 14.8 g/dL (ref 12.0–15.0)
MCH: 30.6 pg (ref 26.0–34.0)
MCHC: 34.9 g/dL (ref 30.0–36.0)
MCV: 87.8 fL (ref 78.0–100.0)
PLATELETS: 136 10*3/uL — AB (ref 150–400)
RBC: 4.83 MIL/uL (ref 3.87–5.11)
RDW: 13.5 % (ref 11.5–15.5)
WBC: 6.7 10*3/uL (ref 4.0–10.5)

## 2018-01-18 LAB — ABO/RH: ABO/RH(D): O POS

## 2018-01-18 LAB — HCG, SERUM, QUALITATIVE: Preg, Serum: NEGATIVE

## 2018-01-18 NOTE — Progress Notes (Signed)
CBC RESULT DATED 01-18-2018 SENT TO DR Phineas Real IN Epic.

## 2018-01-19 ENCOUNTER — Encounter: Payer: Self-pay | Admitting: Gynecology

## 2018-01-19 ENCOUNTER — Ambulatory Visit: Payer: BC Managed Care – PPO | Admitting: Gynecology

## 2018-01-19 VITALS — BP 124/80

## 2018-01-19 DIAGNOSIS — N83201 Unspecified ovarian cyst, right side: Secondary | ICD-10-CM | POA: Diagnosis not present

## 2018-01-19 DIAGNOSIS — R102 Pelvic and perineal pain: Secondary | ICD-10-CM | POA: Diagnosis not present

## 2018-01-19 DIAGNOSIS — D259 Leiomyoma of uterus, unspecified: Secondary | ICD-10-CM

## 2018-01-19 NOTE — Patient Instructions (Signed)
Followup for surgery as scheduled. 

## 2018-01-19 NOTE — Progress Notes (Signed)
    Shelly Carlson 04/22/1971 [id number]        47 y.o.  G0P0000 presents for her preoperative consult for her upcoming abdominal myomectomy right ovarian cystectomy.  Her full history is documented in her dictated history and physical that I dictated for the OR today.  Past medical history,surgical history, problem list, medications, allergies, family history and social history were all reviewed and documented in the EPIC chart.  Directed ROS with pertinent positives and negatives documented in the history of present illness/assessment and plan.  Exam: [name] assistant Vitals:   01/19/18 0853  BP: 124/80   General appearance:  Normal HEENT normal Lungs clear Cardiac regular rate no rubs murmurs or gallops Abdomen soft nontender with palpable suprapubic abdominal pelvic mass consistent with her leiomyoma Pelvic external BUS vagina normal.  Cervix normal.  Uterus bulky 16 to 18 weeks size mass filling the pelvis.  Assessment/Plan:  47 y.o. G0P0000 with history of irregular bleeding with initial exam showing bulky uterus and subsequent ultrasound showing a large myoma and smaller secondary myoma and 6 cm right ovarian cystic mass consistent with endometrioma.  Maternal history of leiomyoma and endometriosis.  Patient now developing symptoms of pelvic pressure and discomfort.  Options for management reviewed with the patient as previously outlined in office notes as well as an dictated history and physical for the OR today.  The risks of the surgery and the expected intraoperative and postoperative courses were extensively reviewed with the patient.  The particular issues with her myomectomy and her ovarian cyst as well as the questionable endometrial polyp were also extensively reviewed.  All of this is documented in my dictated history and physical for the operating room today.   Anastasio Auerbach MD, 9:15 AM 01/19/2018

## 2018-01-19 NOTE — H&P (Signed)
Shelly Carlson 08/13/1970 [id number]   History and Physical  Chief complaint: Leiomyoma, right ovarian cyst, pelvic pain  History of present illness: 47 y.o. G0P0000 initially evaluated 08/2017 for annual exam noting some irregular bleeding.  Exam at that time showed large bulky pelvic mass.  Subsequent ultrasound showed a large single anterior myoma 12 x 7.5 x 11.5 cm and a smaller posterior myoma 2.8 x 2.5 cm.  Also noted was a right ovarian cystic mass with low-level internal echoes 63 x 64 x 53 mm suspicious for endometrioma.  Left ovary not visualized.  Ca125 returned 38.  Endometrial sample showed proliferative endometrium with some vascular changes suggesting possible polyp.  A sonohysterogram was attempted but due to obstruction from the myoma was  suboptimal and no polyps were visualized.  Endometrial echo was also measured at 5 mm.  Options for management were reviewed with the patient and at that time elected expectant management.  She subsequently developed pelvic pain/pressure.  Follow-up ultrasound 12/2017 shows anterior myoma measuring 14 x 7.8 x 14 cm and the smaller myoma measuring 2.7 x 2.8 cm.  The right cystic ovarian masses persisted at 62 x 45 x 56 mm with negative color flow Doppler.   Past Medical History:  Diagnosis Date  . Frequency of urination   . History of palpitations    ED visit 10-24-2013  w/ near syncope-- per ed / cardiology dx ectopic atrial foci (lopressor bid and vagal maneuvers instructions)  . Hypertension   . Leiomyoma   . Right ovarian cyst   . Wears glasses     Past Surgical History:  Procedure Laterality Date  . NO PAST SURGERIES      Family History  Problem Relation Age of Onset  . Hypertension Mother   . Diabetes Mother   . ALS Father     Social History:  reports that she has never smoked. She has never used smokeless tobacco. She reports that she drinks alcohol. She reports that she does not use drugs.  Allergies:  Allergies  Allergen  Reactions  . Codeine Hives  . Oxycodone Hives    Medications: See epic for most current list  ROS:  Was performed and pertinent positives and negatives are included in the history of present illness.  Exam: 01/19/2018 [name] assistant Vitals:   01/19/18 0853  BP: 124/80   General appearance:  Normal HEENT normal Lungs clear Cardiac regular rate no rubs murmurs or gallops Abdomen soft nontender with palpable suprapubic abdominal pelvic mass consistent with her leiomyoma Pelvic external BUS vagina normal.  Cervix normal.  Uterus bulky 16 to 18 weeks size mass filling the pelvis.  Assessment/Plan:  47 y.o. G0P0000 with enlarged uterus and persistent right ovarian cystic mass suspicious for endometrioma now with pelvic pressure and pain.  The options for management were reviewed with the patient and she elects to proceed with myomectomy and right ovarian cystectomy wanting to maintain childbearing possibilities.  We discussed options for approach to include open abdominal versus attempted robotic and the patient elects for open abdominal.  The general risks of surgery were discussed to include the expected intraoperative and postoperative courses as well as the recovery period. The risks of infection, prolonged antibiotics, reoperation for abscess or hematoma formation was discussed. The risks of hemorrhage necessitating transfusion and the risks of transfusion reaction, hepatitis, HIV, mad cow disease and other unknown entities was also discussed. Incisional complications to include opening and draining of incisions and closure by secondary intention, dehiscence and long-term  issues of keloid/cosmetics and hernia formation were reviewed. The risk of inadvertent injury to internal organs including bowel, bladder, ureters, vessels, nerves either immediately recognized or delay recognized necessitating major exploratory reparative surgeries and future reparative surgeries including bowel  resection, ostomy formation, bladder repair, ureteral damage repair was discussed with her.  The particular risks of her surgery were discussed to include the risks of myomectomy and realistic issues of bleeding and transfusion, uterine scarring from the myomectomy leading to issues with subsequent pregnancies such as infertility related to tubal obstruction or cavity scarring as well as the possible/probable need for cesarean section with any subsequent pregnancy due to risks of uterine rupture.  The risks of growth of new myomas in the future and no guarantees that this will not be an issue in the future.  The unlikely event of catastrophic bleeding or complications that would require hysterectomy with absolute irreversible sterility.  We discussed the issues of the right ovarian cystectomy.  If significant scarring/endometriosis encountered then a right salpingo-oophorectomy may be necessary to complete the surgery which may compromise fertility in the future.  If significant scarring/adhesions from endometriosis encountered then we may abandon removal of the entire cyst and leave endometriosis in situ that would subsequently need to be addressed either postoperatively with medication or further surgeries in the future.  Patient also understands there are no guarantees that this is not ovarian cancer and the potential for future surgeries/treatments and referral to a gynecologic oncologist.  Lastly we discussed the endometrial biopsy raised the possibility of endometrial polyp although was not conclusive and that we are not addressing this at this surgery.  Given the negative biopsy our plan would be menstrual calendar following the surgery and as long as regular than expectant management.  If irregularity or other issues then she understands that we may be addressing the possibility of an endometrial polyp in the future.  The patient's questions were answered to her satisfaction she is ready to proceed with  surgery.    Anastasio Auerbach MD, 9:21 AM 01/19/2018

## 2018-01-25 ENCOUNTER — Encounter (HOSPITAL_BASED_OUTPATIENT_CLINIC_OR_DEPARTMENT_OTHER)
Admission: RE | Disposition: A | Discharge status: Home / Self Care | Payer: Self-pay | Source: Ambulatory Visit | Attending: Gynecology

## 2018-01-25 ENCOUNTER — Encounter (HOSPITAL_BASED_OUTPATIENT_CLINIC_OR_DEPARTMENT_OTHER): Payer: Self-pay | Admitting: Emergency Medicine

## 2018-01-25 ENCOUNTER — Ambulatory Visit (HOSPITAL_BASED_OUTPATIENT_CLINIC_OR_DEPARTMENT_OTHER)
Admission: RE | Admit: 2018-01-25 | Discharge: 2018-01-26 | Disposition: A | Discharge status: Home / Self Care | Payer: BC Managed Care – PPO | Source: Ambulatory Visit | Attending: Gynecology | Admitting: Gynecology

## 2018-01-25 ENCOUNTER — Ambulatory Visit (HOSPITAL_BASED_OUTPATIENT_CLINIC_OR_DEPARTMENT_OTHER): Payer: BC Managed Care – PPO | Admitting: Anesthesiology

## 2018-01-25 DIAGNOSIS — Z79899 Other long term (current) drug therapy: Secondary | ICD-10-CM | POA: Diagnosis not present

## 2018-01-25 DIAGNOSIS — D252 Subserosal leiomyoma of uterus: Secondary | ICD-10-CM | POA: Insufficient documentation

## 2018-01-25 DIAGNOSIS — Z885 Allergy status to narcotic agent status: Secondary | ICD-10-CM | POA: Diagnosis not present

## 2018-01-25 DIAGNOSIS — N83201 Unspecified ovarian cyst, right side: Secondary | ICD-10-CM | POA: Insufficient documentation

## 2018-01-25 DIAGNOSIS — N803 Endometriosis of pelvic peritoneum: Secondary | ICD-10-CM | POA: Diagnosis not present

## 2018-01-25 DIAGNOSIS — N800 Endometriosis of uterus: Secondary | ICD-10-CM | POA: Diagnosis not present

## 2018-01-25 DIAGNOSIS — D259 Leiomyoma of uterus, unspecified: Secondary | ICD-10-CM | POA: Diagnosis not present

## 2018-01-25 DIAGNOSIS — I10 Essential (primary) hypertension: Secondary | ICD-10-CM | POA: Diagnosis not present

## 2018-01-25 DIAGNOSIS — D219 Benign neoplasm of connective and other soft tissue, unspecified: Secondary | ICD-10-CM | POA: Diagnosis present

## 2018-01-25 HISTORY — PX: MYOMECTOMY: SHX85

## 2018-01-25 HISTORY — PX: OVARIAN CYST REMOVAL: SHX89

## 2018-01-25 LAB — TYPE AND SCREEN
ABO/RH(D): O POS
Antibody Screen: NEGATIVE

## 2018-01-25 SURGERY — MYOMECTOMY, ABDOMINAL APPROACH
Anesthesia: General | Site: Abdomen | Laterality: Right

## 2018-01-25 MED ORDER — ONDANSETRON HCL 4 MG PO TABS
4.0000 mg | ORAL_TABLET | Freq: Four times a day (QID) | ORAL | Status: DC | PRN
  Filled 2018-01-25: qty 1

## 2018-01-25 MED ORDER — LACTATED RINGERS IV SOLN
INTRAVENOUS | Status: DC
  Filled 2018-01-25: qty 1000

## 2018-01-25 MED ORDER — SCOPOLAMINE 1 MG/3DAYS TD PT72
MEDICATED_PATCH | TRANSDERMAL | Status: AC
  Filled 2018-01-25: qty 1

## 2018-01-25 MED ORDER — LIDOCAINE 2% (20 MG/ML) 5 ML SYRINGE
INTRAMUSCULAR | Status: AC
  Filled 2018-01-25: qty 5

## 2018-01-25 MED ORDER — PROPOFOL 10 MG/ML IV BOLUS
INTRAVENOUS | Status: AC
  Filled 2018-01-25: qty 40

## 2018-01-25 MED ORDER — ONDANSETRON HCL 4 MG/2ML IJ SOLN
INTRAMUSCULAR | Status: DC | PRN
  Administered 2018-01-25: 4 mg via INTRAVENOUS

## 2018-01-25 MED ORDER — METOCLOPRAMIDE HCL 5 MG/ML IJ SOLN
10.0000 mg | Freq: Once | INTRAMUSCULAR | Status: DC | PRN
  Filled 2018-01-25: qty 2

## 2018-01-25 MED ORDER — SUGAMMADEX SODIUM 200 MG/2ML IV SOLN
INTRAVENOUS | Status: DC | PRN
  Administered 2018-01-25: 200 mg via INTRAVENOUS

## 2018-01-25 MED ORDER — SUGAMMADEX SODIUM 200 MG/2ML IV SOLN
INTRAVENOUS | Status: AC
  Filled 2018-01-25: qty 2

## 2018-01-25 MED ORDER — MIDAZOLAM HCL 2 MG/2ML IJ SOLN
INTRAMUSCULAR | Status: AC
  Filled 2018-01-25: qty 2

## 2018-01-25 MED ORDER — ONDANSETRON HCL 4 MG/2ML IJ SOLN
4.0000 mg | Freq: Four times a day (QID) | INTRAMUSCULAR | Status: DC | PRN
  Administered 2018-01-25: 4 mg via INTRAVENOUS
  Filled 2018-01-25: qty 2

## 2018-01-25 MED ORDER — DEXAMETHASONE SODIUM PHOSPHATE 10 MG/ML IJ SOLN
INTRAMUSCULAR | Status: AC
  Filled 2018-01-25: qty 1

## 2018-01-25 MED ORDER — FENTANYL CITRATE (PF) 100 MCG/2ML IJ SOLN
INTRAMUSCULAR | Status: DC | PRN
  Administered 2018-01-25: 50 ug via INTRAVENOUS
  Administered 2018-01-25: 100 ug via INTRAVENOUS
  Administered 2018-01-25 (×2): 50 ug via INTRAVENOUS

## 2018-01-25 MED ORDER — LACTATED RINGERS IV SOLN
INTRAVENOUS | Status: DC
  Administered 2018-01-25 (×2): via INTRAVENOUS
  Filled 2018-01-25: qty 1000

## 2018-01-25 MED ORDER — ONDANSETRON HCL 4 MG/2ML IJ SOLN
INTRAMUSCULAR | Status: AC
  Filled 2018-01-25: qty 2

## 2018-01-25 MED ORDER — LIDOCAINE 2% (20 MG/ML) 5 ML SYRINGE
INTRAMUSCULAR | Status: DC | PRN
  Administered 2018-01-25: 100 mg via INTRAVENOUS

## 2018-01-25 MED ORDER — FENTANYL CITRATE (PF) 100 MCG/2ML IJ SOLN
25.0000 ug | INTRAMUSCULAR | Status: DC | PRN
  Administered 2018-01-25 (×3): 25 ug via INTRAVENOUS
  Filled 2018-01-25: qty 1

## 2018-01-25 MED ORDER — EPHEDRINE 5 MG/ML INJ
INTRAVENOUS | Status: AC
  Filled 2018-01-25: qty 10

## 2018-01-25 MED ORDER — SODIUM CHLORIDE 0.9 % IV SOLN
INTRAVENOUS | Status: AC
  Filled 2018-01-25: qty 2

## 2018-01-25 MED ORDER — ROCURONIUM BROMIDE 10 MG/ML (PF) SYRINGE
PREFILLED_SYRINGE | INTRAVENOUS | Status: DC | PRN
  Administered 2018-01-25: 50 mg via INTRAVENOUS
  Administered 2018-01-25: 10 mg via INTRAVENOUS

## 2018-01-25 MED ORDER — PROPOFOL 10 MG/ML IV BOLUS
INTRAVENOUS | Status: DC | PRN
  Administered 2018-01-25: 200 mg via INTRAVENOUS

## 2018-01-25 MED ORDER — SODIUM CHLORIDE 0.9 % IV SOLN
2.0000 g | INTRAVENOUS | Status: AC
  Administered 2018-01-25: 2 g via INTRAVENOUS
  Filled 2018-01-25: qty 2

## 2018-01-25 MED ORDER — KETOROLAC TROMETHAMINE 30 MG/ML IJ SOLN
INTRAMUSCULAR | Status: AC
  Filled 2018-01-25: qty 1

## 2018-01-25 MED ORDER — EPHEDRINE SULFATE-NACL 50-0.9 MG/10ML-% IV SOSY
PREFILLED_SYRINGE | INTRAVENOUS | Status: DC | PRN
  Administered 2018-01-25: 10 mg via INTRAVENOUS
  Administered 2018-01-25: 15 mg via INTRAVENOUS

## 2018-01-25 MED ORDER — DIPHENHYDRAMINE HCL 50 MG PO CAPS
50.0000 mg | ORAL_CAPSULE | Freq: Four times a day (QID) | ORAL | Status: DC | PRN
  Filled 2018-01-25: qty 1

## 2018-01-25 MED ORDER — MEPERIDINE HCL 25 MG/ML IJ SOLN
6.2500 mg | INTRAMUSCULAR | Status: DC | PRN
  Filled 2018-01-25: qty 1

## 2018-01-25 MED ORDER — FENTANYL CITRATE (PF) 100 MCG/2ML IJ SOLN
INTRAMUSCULAR | Status: AC
Start: 2018-01-25 — End: ?
  Filled 2018-01-25: qty 2

## 2018-01-25 MED ORDER — HYDROMORPHONE HCL 1 MG/ML IJ SOLN
INTRAMUSCULAR | Status: DC | PRN
  Administered 2018-01-25 (×3): 0.5 mg via INTRAVENOUS

## 2018-01-25 MED ORDER — DEXTROSE-NACL 5-0.9 % IV SOLN
INTRAVENOUS | Status: DC
  Administered 2018-01-26: 02:00:00 via INTRAVENOUS
  Filled 2018-01-25 (×3): qty 1000

## 2018-01-25 MED ORDER — HYDROMORPHONE HCL 2 MG/ML IJ SOLN
INTRAMUSCULAR | Status: AC
  Filled 2018-01-25: qty 1

## 2018-01-25 MED ORDER — HYDROMORPHONE HCL 2 MG PO TABS
2.0000 mg | ORAL_TABLET | ORAL | Status: DC | PRN
  Filled 2018-01-25: qty 1

## 2018-01-25 MED ORDER — SODIUM CHLORIDE 0.9 % IV SOLN
INTRAVENOUS | Status: DC | PRN
  Administered 2018-01-25: 35 mL

## 2018-01-25 MED ORDER — MIDAZOLAM HCL 2 MG/2ML IJ SOLN
INTRAMUSCULAR | Status: DC | PRN
  Administered 2018-01-25: 2 mg via INTRAVENOUS

## 2018-01-25 MED ORDER — VASOPRESSIN 20 UNIT/ML IV SOLN
INTRAVENOUS | Status: DC | PRN
  Administered 2018-01-25: 26 mL via INTRAMUSCULAR

## 2018-01-25 MED ORDER — METOPROLOL SUCCINATE ER 50 MG PO TB24
50.0000 mg | ORAL_TABLET | Freq: Every day | ORAL | Status: DC
  Administered 2018-01-25: 50 mg via ORAL
  Filled 2018-01-25: qty 1

## 2018-01-25 MED ORDER — ROCURONIUM BROMIDE 100 MG/10ML IV SOLN
INTRAVENOUS | Status: AC
  Filled 2018-01-25: qty 1

## 2018-01-25 MED ORDER — KETOROLAC TROMETHAMINE 30 MG/ML IJ SOLN
30.0000 mg | Freq: Four times a day (QID) | INTRAMUSCULAR | Status: DC
  Filled 2018-01-25: qty 1

## 2018-01-25 MED ORDER — KETOROLAC TROMETHAMINE 30 MG/ML IJ SOLN
30.0000 mg | Freq: Four times a day (QID) | INTRAMUSCULAR | Status: DC
  Administered 2018-01-25 – 2018-01-26 (×3): 30 mg via INTRAVENOUS
  Filled 2018-01-25: qty 1

## 2018-01-25 MED ORDER — SCOPOLAMINE 1 MG/3DAYS TD PT72
MEDICATED_PATCH | TRANSDERMAL | Status: DC | PRN
  Administered 2018-01-25: 1 via TRANSDERMAL

## 2018-01-25 MED ORDER — HYDROMORPHONE HCL 1 MG/ML IJ SOLN
0.2000 mg | INTRAMUSCULAR | Status: DC | PRN
  Filled 2018-01-25: qty 1

## 2018-01-25 MED ORDER — DEXAMETHASONE SODIUM PHOSPHATE 10 MG/ML IJ SOLN
INTRAMUSCULAR | Status: DC | PRN
  Administered 2018-01-25: 10 mg via INTRAVENOUS

## 2018-01-25 MED ORDER — KETOROLAC TROMETHAMINE 30 MG/ML IJ SOLN
INTRAMUSCULAR | Status: DC | PRN
  Administered 2018-01-25: 30 mg via INTRAVENOUS

## 2018-01-25 MED ORDER — FENTANYL CITRATE (PF) 250 MCG/5ML IJ SOLN
INTRAMUSCULAR | Status: AC
  Filled 2018-01-25: qty 5

## 2018-01-25 SURGICAL SUPPLY — 55 items
APL SKNCLS STERI-STRIP NONHPOA (GAUZE/BANDAGES/DRESSINGS) ×2
BARRIER ADHS 3X4 INTERCEED (GAUZE/BANDAGES/DRESSINGS) IMPLANT
BENZOIN TINCTURE PRP APPL 2/3 (GAUZE/BANDAGES/DRESSINGS) ×2 IMPLANT
BRR ADH 4X3 ABS CNTRL BYND (GAUZE/BANDAGES/DRESSINGS)
CANISTER SUCT 3000ML PPV (MISCELLANEOUS) ×4 IMPLANT
CATH FOLEY 2WAY  3CC  8FR (CATHETERS)
CATH FOLEY 2WAY 3CC 8FR (CATHETERS) IMPLANT
CLOSURE WOUND 1/2 X4 (GAUZE/BANDAGES/DRESSINGS) ×1
CONT PATH 16OZ SNAP LID 3702 (MISCELLANEOUS) ×2 IMPLANT
CONT SPEC 4OZ CLIKSEAL STRL BL (MISCELLANEOUS) ×2 IMPLANT
DECANTER SPIKE VIAL GLASS SM (MISCELLANEOUS) ×4 IMPLANT
DRAPE CESAREAN BIRTH W POUCH (DRAPES) ×2 IMPLANT
DRAPE WARM FLUID 44X44 (DRAPE) ×2 IMPLANT
DRSG OPSITE POSTOP 4X10 (GAUZE/BANDAGES/DRESSINGS) ×2 IMPLANT
FILTER STRAW FLUID ASPIR (MISCELLANEOUS) IMPLANT
GAUZE SPONGE 4X4 12PLY STRL LF (GAUZE/BANDAGES/DRESSINGS) IMPLANT
GAUZE SPONGE 4X4 16PLY XRAY LF (GAUZE/BANDAGES/DRESSINGS) ×4 IMPLANT
GLOVE BIO SURGEON STRL SZ7.5 (GLOVE) ×4 IMPLANT
GLOVE BIOGEL PI IND STRL 7.0 (GLOVE) ×4 IMPLANT
GLOVE BIOGEL PI IND STRL 7.5 (GLOVE) IMPLANT
GLOVE BIOGEL PI IND STRL 8 (GLOVE) ×2 IMPLANT
GLOVE BIOGEL PI INDICATOR 7.0 (GLOVE) ×4
GLOVE BIOGEL PI INDICATOR 7.5 (GLOVE) ×8
GLOVE BIOGEL PI INDICATOR 8 (GLOVE) ×2
GLOVE ECLIPSE 7.5 STRL STRAW (GLOVE) ×4 IMPLANT
GOWN STRL REUS W/TWL LRG LVL3 (GOWN DISPOSABLE) ×12 IMPLANT
HYDROGEN PEROXIDE 16OZ (MISCELLANEOUS) ×4 IMPLANT
IV STOPCOCK 4 WAY 40  W/Y SET (IV SOLUTION)
IV STOPCOCK 4 WAY 40 W/Y SET (IV SOLUTION) IMPLANT
NDL HYPO 18GX1.5 BLUNT FILL (NEEDLE) IMPLANT
NEEDLE HYPO 18GX1.5 BLUNT FILL (NEEDLE) IMPLANT
NEEDLE HYPO 22GX1.5 SAFETY (NEEDLE) ×8 IMPLANT
NS IRRIG 1000ML POUR BTL (IV SOLUTION) ×8 IMPLANT
PACK ABDOMINAL GYN (CUSTOM PROCEDURE TRAY) ×4 IMPLANT
PAD OB MATERNITY 4.3X12.25 (PERSONAL CARE ITEMS) ×4 IMPLANT
PROTECTOR NERVE ULNAR (MISCELLANEOUS) ×4 IMPLANT
SPONGE LAP 18X18 RF (DISPOSABLE) ×2 IMPLANT
STRIP CLOSURE SKIN 1/2X4 (GAUZE/BANDAGES/DRESSINGS) ×1 IMPLANT
SUT PDS AB 0 CT1 27 (SUTURE) IMPLANT
SUT PDS AB 2-0 CT1 27 (SUTURE) IMPLANT
SUT PDS AB 3-0 SH 27 (SUTURE) ×8 IMPLANT
SUT VIC AB 0 CT1 18XCR BRD8 (SUTURE) ×2 IMPLANT
SUT VIC AB 0 CT1 27 (SUTURE) ×8
SUT VIC AB 0 CT1 27XBRD ANBCTR (SUTURE) ×6 IMPLANT
SUT VIC AB 0 CT1 8-18 (SUTURE) ×4
SUT VIC AB 2-0 SH 27 (SUTURE) ×4
SUT VIC AB 2-0 SH 27XBRD (SUTURE) IMPLANT
SUT VIC AB 4-0 KS 27 (SUTURE) ×4 IMPLANT
SYR 20CC LL (SYRINGE) IMPLANT
SYR 3ML 22GX1 1/2 SAFETY (SYRINGE) IMPLANT
SYR 50ML LL SCALE MARK (SYRINGE) IMPLANT
SYR 5ML LL (SYRINGE) ×4 IMPLANT
SYR CONTROL 10ML LL (SYRINGE) ×6 IMPLANT
TOWEL OR 17X24 6PK STRL BLUE (TOWEL DISPOSABLE) ×8 IMPLANT
TRAY FOLEY W/BAG SLVR 14FR (SET/KITS/TRAYS/PACK) ×4 IMPLANT

## 2018-01-25 NOTE — Anesthesia Preprocedure Evaluation (Signed)
Anesthesia Evaluation  Patient identified by MRN, date of birth, ID band Patient awake    Reviewed: Allergy & Precautions, NPO status , Patient's Chart, lab work & pertinent test results  Airway Mallampati: II  TM Distance: >3 FB Neck ROM: Full    Dental no notable dental hx.    Pulmonary neg pulmonary ROS,    Pulmonary exam normal breath sounds clear to auscultation       Cardiovascular hypertension, Pt. on medications and Pt. on home beta blockers Normal cardiovascular exam Rhythm:Regular Rate:Normal     Neuro/Psych negative neurological ROS  negative psych ROS   GI/Hepatic negative GI ROS, Neg liver ROS,   Endo/Other  negative endocrine ROS  Renal/GU negative Renal ROS  negative genitourinary   Musculoskeletal negative musculoskeletal ROS (+)   Abdominal   Peds negative pediatric ROS (+)  Hematology negative hematology ROS (+)   Anesthesia Other Findings   Reproductive/Obstetrics negative OB ROS                             Anesthesia Physical Anesthesia Plan  ASA: II  Anesthesia Plan: General   Post-op Pain Management:    Induction: Intravenous  PONV Risk Score and Plan: 4 or greater and Ondansetron, Dexamethasone, Midazolam, Scopolamine patch - Pre-op and Treatment may vary due to age or medical condition  Airway Management Planned: Oral ETT  Additional Equipment:   Intra-op Plan:   Post-operative Plan: Extubation in OR  Informed Consent: I have reviewed the patients History and Physical, chart, labs and discussed the procedure including the risks, benefits and alternatives for the proposed anesthesia with the patient or authorized representative who has indicated his/her understanding and acceptance.   Dental advisory given  Plan Discussed with: CRNA  Anesthesia Plan Comments:         Anesthesia Quick Evaluation

## 2018-01-25 NOTE — Anesthesia Procedure Notes (Addendum)
Procedure Name: Intubation Date/Time: 01/25/2018 7:38 AM Performed by: Montez Hageman, MD Pre-anesthesia Checklist: Patient identified, Emergency Drugs available, Suction available and Patient being monitored Patient Re-evaluated:Patient Re-evaluated prior to induction Oxygen Delivery Method: Circle system utilized Preoxygenation: Pre-oxygenation with 100% oxygen Induction Type: IV induction and Cricoid Pressure applied Ventilation: Mask ventilation without difficulty Laryngoscope Size: Mac and 4 Grade View: Grade III Tube type: Oral Tube size: 7.0 mm Number of attempts: 1 Airway Equipment and Method: Bougie stylet Placement Confirmation: ETT inserted through vocal cords under direct vision,  positive ETCO2 and breath sounds checked- equal and bilateral Secured at: 20 cm Tube secured with: Tape Dental Injury: Teeth and Oropharynx as per pre-operative assessment  Difficulty Due To: Difficulty was unanticipated, Difficult Airway- due to anterior larynx and Difficult Airway- due to large tongue Future Recommendations: Recommend- induction with short-acting agent, and alternative techniques readily available Comments: Patient has large amount of long braided hair.  Difficult to position head correctly.  Dr. Thomes Cake holding head in sniffing position. Attempted to DL with MAC 3 x 1. Unable to visualize glottic opening.  DL x 1 with MAC 4. Only visualized epiglottis and arytenoids.  Bougie positioned easily under epiglottis.  7.0 placed over bougie easily.  VSS. +ETCO2. EBBS.

## 2018-01-25 NOTE — Transfer of Care (Signed)
   Last Vitals:  Vitals Value Taken Time  BP 133/61 01/25/2018  9:53 AM  Temp 36.9 C 01/25/2018  9:53 AM  Pulse 89 01/25/2018 10:00 AM  Resp 20 01/25/2018 10:00 AM  SpO2 97 % 01/25/2018 10:00 AM  Vitals shown include unvalidated device data.  Last Pain:  Vitals:   01/25/18 0953  TempSrc:   PainSc: 8         Immediate Anesthesia Transfer of Care Note  Patient: Shelly Carlson  Procedure(s) Performed: Procedure(s) (LRB): ABDOMINAL MYOMECTOMY (N/A) OVARIAN CYSTECTOMY (Right)  Patient Location: PACU  Anesthesia Type: General  Level of Consciousness: awake, alert  and oriented  Airway & Oxygen Therapy: Patient Spontanous Breathing and Patient connected to nasal cannula oxygen  Post-op Assessment: Report given to PACU RN and Post -op Vital signs reviewed and stable  Post vital signs: Reviewed and stable  Complications: No apparent anesthesia complications

## 2018-01-25 NOTE — H&P (Signed)
The patient was examined.  I reviewed the proposed surgery and consent form with the patient.  The dictated history and physical is current and accurate and all questions were answered. The patient is ready to proceed with surgery and has a realistic understanding and expectation for the outcome.   Anastasio Auerbach MD, 7:27 AM 01/25/2018

## 2018-01-25 NOTE — Op Note (Signed)
Shelly Carlson 1971/06/08 [id number]   Post Operative Note   Date of surgery:  01/25/2018  Pre Op Dx: Leiomyoma, right ovarian cystic mass, pelvic pain  Post Op Dx: Leiomyoma, endometriosis, right endometrioma, severe pelvic adhesions  Procedure: Exploratory laparotomy, multiple myomectomy, lysis of adhesions, drainage right ovarian endometrioma  Surgeon:  Anastasio Auerbach  Assistant: Princess Bruins  Anesthesia:  General  EBL: 193 cc  Complications:  None  Specimen: #1 opening cell washing #2 right ovarian cyst wall biopsy #3 leiomyoma to pathology  Findings: EUA: Abdominal pelvic mass to 2 fingerbreadths below umbilicus   Operative: Anterior cul-de-sac with multiple areas of brown lesions consistent with endometriosis.  Puckered area of anterior peritoneum to left lower anterior uterine serosa sharply lysed to help mobilize the uterus.  Posterior cul-de-sac obliterated with intestinal adhesions to near fundal uterine surface.  Left adnexa obscured with distal fallopian tube and fimbria visualized and normal but ovary obscured by dense intestinal adhesions to left uterus and left pelvic sidewall, left undisturbed.  Right adnexa with normal-appearing fallopian tube in length, caliber and fimbriated end with normal-appearing portion of ovary with posterior endometrioma involving lower uterine segment and right pelvic sidewall.  Endometrioma was entered and drained and a window was cut into the cyst wall.  Uterus enlarged with large multilobed myoma mid fundus removed.  3 smaller myomas subsequently removed.  No remaining myomas palpated.  Endometrial cavity not identified.  Upper abdominal exam palpates normal.  Procedure: The patient was taken to the operating room, underwent general anesthesia, received an abdominal preparation per nursing personnel and a Foley catheter was placed in sterile technique.  The timeout was performed by the surgical team.  The patient was draped in the  usual fashion and the abdomen was sharply entered through a Pfannenstiel incision achieving adequate hemostasis at all levels.  Examination of the pelvis and upper abdominal exam were performed with findings noted above.  An opening cell washing was obtained and sent to cytology.  The anterior peritoneal to lower uterine segment adhesions were lysed to help mobilize the uterus.  Inspection of the left adnexa was made and it was felt that any attempts at adhesiolysis unwise given the dense nature of the adhesions, risk of bowel/sidewall injury, no pathology demonstrated on ultrasound preoperatively and the patient not actively pursuing pregnancy at this time.  To aid in visualization a medial one third rectus muscle splitting was performed bilaterally with electrocautery.  A Balfour retractor and bladder blade were then placed within the incision and the upper intestines were packed from the pelvis using moist laps.  The right ovarian cyst was entered with gross chocolate-like material consistent with endometrioma extruded.  This was copiously irrigated and aspirated.  Due to the extent of the cyst and dense adhesions to the posterior lower uterus and pelvic sidewall it was felt ill advised to attempt resection of the entire cyst wall for fear of injury of surrounding structures.  A window was cut with sample sent to pathology to allow for drainage.  Attention was then turned to the myomectomy portion of the procedure.  The serosa of the fundus was infiltrated with vasopressin solution 20 units per 50 cc and subsequently was sharply incised in the midline using electrocautery.  Through blunt and sharp dissection a large single lobed myoma was excised.  2 smaller myomas approximately 3 cm and 0.5 cm in the right fundal region away from the tubal insertion were also excised through the initial incision.  A smaller 2  cm subserosal myoma in the lower left anterior uterine segment was excised.  No remaining myomas were  visualized or palpated.  The endometrial cavity was not identified and did not appear that we entered into the cavity.  The myometrium was then reapproximated using 0 Vicryl suture in interrupted figure-of-eight stitch.  The serosal surface was then reapproximated using 2-0 Vicryl in a running stitch.  The Balfour retractor and bladder blade were removed as were the lap sponges.  The anterior fascia was then reapproximated using 0 Vicryl suture starting at the angle meeting in the middle.  Subcutaneous tissues were irrigated with hemostasis achieved using electrocautery and Exparel was injected subcutaneously around the entire incisional edge.  The skin was then reapproximated using 4-0 Vicryl in a running subcuticular stitch, benzoin with Steri-Strips applied and a subsequent sterile dressing applied.  The sponge, needle and instrument counts were verified correct.  The specimens identified for pathology.  The patient received intraoperative Toradol and was subsequently awakened without difficulty having tolerated procedure well.   Anastasio Auerbach MD, 10:05 AM 01/25/2018

## 2018-01-25 NOTE — Addendum Note (Signed)
Addendum  created 01/25/18 1152 by Mechele Claude, CRNA   Charge Capture section accepted

## 2018-01-25 NOTE — Progress Notes (Signed)
Patient ID: Shelly Carlson, female   DOB: [birth year], 47 y.o.   MRN: [id number]  In to see patient.  Awake and alert in no acute distress.  Ambulated once but became nauseous and return to room.  Nurses noted that her dressing was a little more blood stained.  Afebrile vital signs stable Abdomen soft with mild tenderness.  Honeycomb dressing intact with some blood staining but mostly clear without blood staining.  Dressing was reinforced with a pressure dressing.  Assessment/plan: Normal postop check day of surgery.  Mild blood staining on dressing secondary to ambulation but no evidence of active bleeding from the incision site.  Dressing was reinforced regardless.  Results of surgery were discussed with the patient, her husband and mother.  We discussed the myomectomy portion as well as the right-sided endometrioma and left-sided adhesions.  We discussed in general whether she would want to go on an endometriosis suppressing treatment afterwards such as Depo-Lupron.  She was not having significant cramping with her periods or significant pain.  Alternatives would be no active treatment but spontaneous pregnancy trial.  We will further discuss at her postoperative visit in 2 weeks.  We will continue with routine postop care.  Reassess in a.m. to consider discharge later tomorrow.

## 2018-01-25 NOTE — Anesthesia Postprocedure Evaluation (Signed)
Anesthesia Post Note  Patient: Shelly Carlson  Procedure(s) Performed: ABDOMINAL MYOMECTOMY (N/A Abdomen) OVARIAN CYSTECTOMY (Right Abdomen)     Patient location during evaluation: PACU Anesthesia Type: General Level of consciousness: awake and alert Pain management: pain level controlled Vital Signs Assessment: post-procedure vital signs reviewed and stable Respiratory status: spontaneous breathing, nonlabored ventilation, respiratory function stable and patient connected to nasal cannula oxygen Cardiovascular status: blood pressure returned to baseline and stable Postop Assessment: no apparent nausea or vomiting Anesthetic complications: no    Last Vitals:  Vitals:   01/25/18 1100 01/25/18 1115  BP: 129/71 116/64  Pulse: 81   Resp: 13 14  Temp:  36.6 C  SpO2: 97% 95%    Last Pain:  Vitals:   01/25/18 1115  TempSrc:   PainSc: 5                  Montez Hageman

## 2018-01-26 ENCOUNTER — Encounter (HOSPITAL_BASED_OUTPATIENT_CLINIC_OR_DEPARTMENT_OTHER): Payer: Self-pay | Admitting: Gynecology

## 2018-01-26 DIAGNOSIS — D252 Subserosal leiomyoma of uterus: Secondary | ICD-10-CM | POA: Diagnosis not present

## 2018-01-26 LAB — CBC
HEMATOCRIT: 29.4 % — AB (ref 36.0–46.0)
HEMOGLOBIN: 10.4 g/dL — AB (ref 12.0–15.0)
MCH: 31 pg (ref 26.0–34.0)
MCHC: 35.4 g/dL (ref 30.0–36.0)
MCV: 87.8 fL (ref 78.0–100.0)
Platelets: 112 10*3/uL — ABNORMAL LOW (ref 150–400)
RBC: 3.35 MIL/uL — AB (ref 3.87–5.11)
RDW: 13.5 % (ref 11.5–15.5)
WBC: 7.8 10*3/uL (ref 4.0–10.5)

## 2018-01-26 MED ORDER — IBUPROFEN 800 MG PO TABS: 800.0000 mg | ORAL_TABLET | Freq: Three times a day (TID) | ORAL | 1 refills | Status: DC | PRN

## 2018-01-26 MED ORDER — HYDROMORPHONE HCL 2 MG PO TABS: 2.0000 mg | ORAL_TABLET | ORAL | 0 refills | Status: DC | PRN

## 2018-01-26 MED ORDER — KETOROLAC TROMETHAMINE 30 MG/ML IJ SOLN
INTRAMUSCULAR | Status: AC
  Filled 2018-01-26: qty 1

## 2018-01-26 NOTE — Progress Notes (Signed)
Poet Ardito 10/15/1970 [id number]   1 Day Post-Op Procedure(s) (LRB): ABDOMINAL MYOMECTOMY (N/A) OVARIAN CYSTECTOMY (Right)  Subjective: Patient reports feels well, no acute distress, pain severity reported mild, Yes.   taking PO without nausea, foley catheter out, yes voiding, Yes.  ambulating, No. passing flatus  Objective: Vital signs in last 24 hours: Temp:  [97.8 F (36.6 C)-99 F (37.2 C)] 98.4 F (36.9 C) (07/09 0721) Pulse Rate:  [62-102] 62 (07/09 0721) Resp:  [10-20] 16 (07/09 0721) BP: (89-133)/(59-84) 109/73 (07/09 0721) SpO2:  [86 %-100 %] 100 % (07/09 0721)      EXAM General: awake, alert and no distress Resp: clear to auscultation bilaterally Cardio: regular rate and rhythm GI: soft, minimal tenderness, bowel sounds active, dressing dry Lower Extremities: Without swelling or tenderness Vaginal Bleeding: Reported none   Lab Results:  Recent Labs    01/26/18 0532  WBC 7.8  HGB 10.4*  HCT 29.4*  PLT 112*    Assessment: s/p Procedure(s): ABDOMINAL MYOMECTOMY OVARIAN CYSTECTOMY: progressing well, ready for discharge.    Plan: Discharge home today.  Precautions, instructions and follow up were discussed with the patient.  Prescriptions provided per AVS.  Patient to call the office to arrange a post-operative appointmant in 2 weeks.    Anastasio Auerbach MD, 7:49 AM 01/26/2018

## 2018-01-26 NOTE — Discharge Summary (Signed)
Shelly Carlson [birth year] [id number]   Discharge Summary  Date of Admission:  01/25/2018  Date of Discharge:  01/26/2018  Discharge Diagnosis: Endometriosis, right endometrioma, leiomyoma, pelvic adhesions  Procedure:  Procedure(s): ABDOMINAL MYOMECTOMY OVARIAN CYSTECTOMY  Pathology:  Diagnosis 1. Ovary, cyst RIGHT OVARY: - FINDINGS CONSISTENT WITH ENDOMETRIOTIC CYST. - NO EVIDENCE OF MALIGNANCY. 2. Fibroid - FRAGMENTS OF LEIOMYOMA. - NO EVIDENCE OF MALIGNANCY.  Hospital Course: Patient underwent an uncomplicated exploratory laparotomy, multiple myomectomy, right ovarian cyst drainage and biopsy.  The patient's postoperative course was uncomplicated and she was discharged on postoperative day #1 ambulating well, tolerating liquids, voiding without difficulty, good pain relief on oral medication with a postoperative hemoglobin of 10.4.  The patient received instructions for postoperative care and call precautions.  She received prescriptions per AVS and will be seen in the office 2 weeks following discharge.       Anastasio Auerbach MD, 4:37 PM 01/26/2018

## 2018-01-26 NOTE — Discharge Instructions (Signed)
°  Postoperative Instructions Hysterectomy ° °Dr. Lielle Vandervort and the nursing staff have discussed postoperative instructions with you.  If you have any questions please ask them before you leave the hospital, or call Dr Ahmari Duerson’s office at [phone].   ° °We would like to emphasize the following instructions: ° ° °  Call the office to make your follow-up appointment as recommended by Dr Roberto Romanoski (usually 2 weeks). ° °  You were given a prescription, or one was ordered for you at the pharmacy you designated.  Get that prescription filled and take the medication according to instructions. ° °  You may eat a regular diet, but slowly until you start having bowel movements. ° °  Drink plenty of water daily. ° °  Nothing in the vagina (intercourse, douching, objects of any kind) until released by Dr Lundynn Cohoon. ° °  No driving for two weeks.  Wait to be cleared by Dr Kaity Pitstick at your first post op check.  Car rides (short) are ok after several days at home, as long as you are not having significant pain, but no traveling out of town. ° °  You may shower, but no baths.  Walking up and down stairs is ok.  No heavy lifting, prolonged standing, repeated bending or any “working out” until your first post op check. ° °  Rest frequently, listen to your body and do not push yourself and overdo it. ° °  Call if: ° °o Your pain medication does not seem strong enough. °o Worsening pain or abdominal bloating °o Persistent nausea or vomiting °o Difficulty with urination or bowel movements. °o Temperature of 101 degrees or higher. °o Bleeding heavier then staining (clots or period type flow). °o Incisions become red, tender or begin to drain. °o You have any questions or concerns. °

## 2018-02-10 ENCOUNTER — Ambulatory Visit (INDEPENDENT_AMBULATORY_CARE_PROVIDER_SITE_OTHER): Payer: BC Managed Care – PPO | Admitting: Gynecology

## 2018-02-10 ENCOUNTER — Encounter: Payer: Self-pay | Admitting: Gynecology

## 2018-02-10 VITALS — BP 118/76

## 2018-02-10 DIAGNOSIS — Z9889 Other specified postprocedural states: Secondary | ICD-10-CM

## 2018-02-10 NOTE — Patient Instructions (Signed)
Follow-up in February 2020 for annual exam when you are due.  Follow-up sooner with decision as far as endometriosis management as you decide.

## 2018-02-10 NOTE — Progress Notes (Signed)
    Shelly Carlson 1970/09/25 [id number]        46 y.o.  G0P0000 presents for her 2-week postoperative visit status post exploratory laparotomy, right endometrioma drainage, multiple myomectomy.  She is done well since surgery with her main complaint being painful gas.  Past medical history,surgical history, problem list, medications, allergies, family history and social history were all reviewed and documented in the EPIC chart.  Directed ROS with pertinent positives and negatives documented in the history of present illness/assessment and plan.  Exam: Vitals:   02/10/18 0824  BP: 118/76   General appearance:  Normal Abdomen soft nontender without masses guarding rebound.  Abdominal incision healed nicely.  Remaining Steri-Strips removed.  Assessment/Plan:  47 y.o. G0P0000 with normal postoperative visit status post exploratory laparotomy, multiple myomectomy and drainage of right endometrioma.  Patient will continue to slowly resume all normal activities.  We discussed the findings of surgery and the pathology report.  She understands that she still has the endometriosis that we drained the right endometrioma cutting a window into it but did not remove all of the wall due to the location and involvement of the cul-de-sac/pelvic sidewall.  Options for management include observation noting she did not have significant pain preoperatively.  Trial for pregnancy now.  Endometriosis suppressive therapy such as progesterone only, combination low-dose oral contraceptives, Depo-Lupron.  Possible Edward Qualia also discussed although more designed for pain theoretically should address endometriosis progression.  Patient wants to think of her options.  She is due for her annual exam in February and will follow-up at that time but will follow-up sooner with her decision as far as management.    Anastasio Auerbach MD, 8:44 AM 02/10/2018

## 2019-01-26 ENCOUNTER — Other Ambulatory Visit: Payer: Self-pay | Admitting: *Deleted

## 2019-01-26 DIAGNOSIS — Z20822 Contact with and (suspected) exposure to covid-19: Secondary | ICD-10-CM

## 2019-01-31 LAB — NOVEL CORONAVIRUS, NAA: SARS-CoV-2, NAA: NOT DETECTED

## 2019-02-08 ENCOUNTER — Telehealth: Payer: Self-pay | Admitting: Internal Medicine

## 2019-02-08 NOTE — Telephone Encounter (Signed)
Patient informed of negative test results.

## 2019-04-12 ENCOUNTER — Encounter: Payer: Self-pay | Admitting: Gynecology

## 2019-09-17 ENCOUNTER — Ambulatory Visit: Payer: Self-pay | Attending: Internal Medicine

## 2019-09-17 DIAGNOSIS — Z23 Encounter for immunization: Secondary | ICD-10-CM | POA: Insufficient documentation

## 2019-09-17 NOTE — Progress Notes (Signed)
   Covid-19 Vaccination Clinic  Name:  Shelly Carlson    MRN: [id number] DOB: [birth year]  09/17/2019  [name] was observed post Covid-19 immunization. She started feeling dizzy and if her ears were clogged at 0946. She was given water, crackers, and a fan. At 0945, BP= 104/67, pulse=68, and O2 sat=99%. At 0955, BP=133/96, pulse=68. Patient waited an additional 15 minutes and fully recovered. She was provided with Vaccine Information Sheet and instruction to access the V-Safe system.   [name] was instructed to call 911 with any severe reactions post vaccine: Marland Kitchen Difficulty breathing  . Swelling of your face and throat  . A fast heartbeat  . A bad rash all over your body  . Dizziness and weakness    Immunizations Administered    Name Date Dose VIS Date Route   Pfizer COVID-19 Vaccine 09/17/2019  9:24 AM 0.3 mL 07/01/2019 Intramuscular   Manufacturer: [place]   Lot: [id number]   [place]: [id number]

## 2019-09-18 ENCOUNTER — Ambulatory Visit: Payer: BC Managed Care – PPO

## 2019-10-08 ENCOUNTER — Ambulatory Visit: Payer: Self-pay | Attending: Internal Medicine

## 2019-10-08 DIAGNOSIS — Z23 Encounter for immunization: Secondary | ICD-10-CM

## 2019-10-08 NOTE — Progress Notes (Signed)
   Covid-19 Vaccination Clinic  Name:  Shelly Carlson    MRN: [id number] DOB: 14-Dec-1970  10/08/2019  [name] was observed post Covid-19 immunization for 15 minutes without incident. She was provided with Vaccine Information Sheet and instruction to access the V-Safe system.   [name] was instructed to call 911 with any severe reactions post vaccine: Marland Kitchen Difficulty breathing  . Swelling of face and throat  . A fast heartbeat  . A bad rash all over body  . Dizziness and weakness   Immunizations Administered    Name Date Dose VIS Date Route   Pfizer COVID-19 Vaccine 10/08/2019 12:49 PM 0.3 mL 07/01/2019 Intramuscular   Manufacturer: [place]   Lot: [id number]   [place]: [id number]

## 2019-10-12 ENCOUNTER — Ambulatory Visit: Payer: Self-pay

## 2020-06-12 ENCOUNTER — Ambulatory Visit: Payer: Self-pay | Attending: Internal Medicine

## 2020-06-12 DIAGNOSIS — Z23 Encounter for immunization: Secondary | ICD-10-CM

## 2020-06-12 NOTE — Progress Notes (Signed)
   Covid-19 Vaccination Clinic  Name:  Krystall Kruckenberg    MRN: [id number] DOB: 11/30/1970  06/12/2020  Ms. Wigington was observed post Covid-19 immunization for 15 minutes without incident. She was provided with Vaccine Information Sheet and instruction to access the V-Safe system.   Ms. Kobel was instructed to call 911 with any severe reactions post vaccine: Marland Kitchen Difficulty breathing  . Swelling of face and throat  . A fast heartbeat  . A bad rash all over body  . Dizziness and weakness   Immunizations Administered    Name Date Dose VIS Date Route   Pfizer COVID-19 Vaccine 06/12/2020  5:44 PM 0.3 mL 05/09/2020 Intramuscular   Manufacturer: [place]   Lot: [id number]   [place]: [id number]

## 2020-06-22 ENCOUNTER — Ambulatory Visit: Payer: Self-pay

## 2024-01-27 LAB — HM MAMMOGRAPHY

## 2024-02-08 ENCOUNTER — Other Ambulatory Visit: Payer: Self-pay | Admitting: Radiology

## 2024-02-09 LAB — SURGICAL PATHOLOGY

## 2024-03-03 ENCOUNTER — Other Ambulatory Visit (HOSPITAL_COMMUNITY)
Admission: RE | Admit: 2024-03-03 | Discharge: 2024-03-03 | Disposition: A | Discharge status: Home / Self Care | Source: Ambulatory Visit | Attending: Radiology | Admitting: Radiology

## 2024-03-03 ENCOUNTER — Encounter: Payer: Self-pay | Admitting: Radiology

## 2024-03-03 ENCOUNTER — Ambulatory Visit (INDEPENDENT_AMBULATORY_CARE_PROVIDER_SITE_OTHER): Admitting: Radiology

## 2024-03-03 VITALS — BP 120/84 | HR 75 | Ht 65.0 in | Wt 234.0 lb

## 2024-03-03 DIAGNOSIS — Z1331 Encounter for screening for depression: Secondary | ICD-10-CM

## 2024-03-03 DIAGNOSIS — Z01419 Encounter for gynecological examination (general) (routine) without abnormal findings: Secondary | ICD-10-CM | POA: Insufficient documentation

## 2024-03-03 DIAGNOSIS — Z1211 Encounter for screening for malignant neoplasm of colon: Secondary | ICD-10-CM

## 2024-03-03 NOTE — Patient Instructions (Signed)
 Preventive Care 51-51 Years Old, Female  Preventive care refers to lifestyle choices and visits with your health care provider that can promote health and wellness. Preventive care visits are also called wellness exams.  What can I expect for my preventive care visit?  Counseling  Your health care provider may ask you questions about your:  Medical history, including:  Past medical problems.  Family medical history.  Pregnancy history.  Current health, including:  Menstrual cycle.  Method of birth control.  Emotional well-being.  Home life and relationship well-being.  Sexual activity and sexual health.  Lifestyle, including:  Alcohol, nicotine or tobacco, and drug use.  Access to firearms.  Diet, exercise, and sleep habits.  Work and work Astronomer.  Sunscreen use.  Safety issues such as seatbelt and bike helmet use.  Physical exam  Your health care provider will check your:  Height and weight. These may be used to calculate your BMI (body mass index). BMI is a measurement that tells if you are at a healthy weight.  Waist circumference. This measures the distance around your waistline. This measurement also tells if you are at a healthy weight and may help predict your risk of certain diseases, such as type 2 diabetes and high blood pressure.  Heart rate and blood pressure.  Body temperature.  Skin for abnormal spots.  What immunizations do I need?    Vaccines are usually given at various ages, according to a schedule. Your health care provider will recommend vaccines for you based on your age, medical history, and lifestyle or other factors, such as travel or where you work.  What tests do I need?  Screening  Your health care provider may recommend screening tests for certain conditions. This may include:  Lipid and cholesterol levels.  Diabetes screening. This is done by checking your blood sugar (glucose) after you have not eaten for a while (fasting).  Pelvic exam and Pap test.  Hepatitis B test.  Hepatitis C  test.  HIV (human immunodeficiency virus) test.  STI (sexually transmitted infection) testing, if you are at risk.  Lung cancer screening.  Colorectal cancer screening.  Mammogram. Talk with your health care provider about when you should start having regular mammograms. This may depend on whether you have a family history of breast cancer.  BRCA-related cancer screening. This may be done if you have a family history of breast, ovarian, tubal, or peritoneal cancers.  Bone density scan. This is done to screen for osteoporosis.  Talk with your health care provider about your test results, treatment options, and if necessary, the need for more tests.  Follow these instructions at home:  Eating and drinking    Eat a diet that includes fresh fruits and vegetables, whole grains, lean protein, and low-fat dairy products.  Take vitamin and mineral supplements as recommended by your health care provider.  Do not drink alcohol if:  Your health care provider tells you not to drink.  You are pregnant, may be pregnant, or are planning to become pregnant.  If you drink alcohol:  Limit how much you have to 0-1 drink a day.  Know how much alcohol is in your drink. In the [place] one drink equals one 12 oz bottle of beer (355 mL), one 5 oz glass of wine (148 mL), or one 1 oz glass of hard liquor (44 mL).  Lifestyle  Brush your teeth every morning and night with fluoride toothpaste. Floss one time each day.  Exercise for at least  30 minutes 5 or more days each week.  Do not use any products that contain nicotine or tobacco. These products include cigarettes, chewing tobacco, and vaping devices, such as e-cigarettes. If you need help quitting, ask your health care provider.  Do not use drugs.  If you are sexually active, practice safe sex. Use a condom or other form of protection to prevent STIs.  If you do not wish to become pregnant, use a form of birth control. If you plan to become pregnant, see your health care provider for a  prepregnancy visit.  Take aspirin only as told by your health care provider. Make sure that you understand how much to take and what form to take. Work with your health care provider to find out whether it is safe and beneficial for you to take aspirin daily.  Find healthy ways to manage stress, such as:  Meditation, yoga, or listening to music.  Journaling.  Talking to a trusted person.  Spending time with friends and family.  Minimize exposure to UV radiation to reduce your risk of skin cancer.  Safety  Always wear your seat belt while driving or riding in a vehicle.  Do not drive:  If you have been drinking alcohol. Do not ride with someone who has been drinking.  When you are tired or distracted.  While texting.  If you have been using any mind-altering substances or drugs.  Wear a helmet and other protective equipment during sports activities.  If you have firearms in your house, make sure you follow all gun safety procedures.  Seek help if you have been physically or sexually abused.  What's next?  Visit your health care provider once a year for an annual wellness visit.  Ask your health care provider how often you should have your eyes and teeth checked.  Stay up to date on all vaccines.  This information is not intended to replace advice given to you by your health care provider. Make sure you discuss any questions you have with your health care provider.  Document Revised: 01/02/2021 Document Reviewed: 01/02/2021  Elsevier Patient Education  2024 [employer].

## 2024-03-03 NOTE — Progress Notes (Signed)
 Shelly Carlson March 15, 1971 [id number]   History:  53 y.o. G0 presents for annual exam.Hx of endometriosis. Had a period 01/11/24, previous period was 11 months ago. Occasional hot flashes if she consumes sugar, otherwise no bothersome menopausal symptoms.  Gynecologic History Patient's last menstrual period was 01/11/2024 (exact date). Period Cycle (Days):  (skipping periods) Period Duration (Days): 10 Period Pattern: (!) Irregular Menstrual Flow: Light Menstrual Control: Thin pad, Maxi pad Dysmenorrhea: None Contraception/Family planning: none Sexually active: no Last Pap: 2019. Results were: normal Last mammogram: 7/25. Results were: abnormal with benign biopsy at [facility]  Obstetric History OB History  Gravida Para Term Preterm AB Living  0 0 0 0 0 0  SAB IAB Ectopic Multiple Live Births  0 0 0 0 0       03/03/2024    9:45 AM  Depression screen PHQ 2/9  Decreased Interest 0  Down, Depressed, Hopeless 0  PHQ - 2 Score 0     The following portions of the patient's history were reviewed and updated as appropriate: allergies, current medications, past family history, past medical history, past social history, past surgical history, and problem list.  Review of Systems  All other systems reviewed and are negative.   Past medical history, past surgical history, family history and social history were all reviewed and documented in the EPIC chart.  Exam:  Vitals:   03/03/24 0944  BP: 120/84  Pulse: 75  SpO2: 97%  Weight: 234 lb (106.1 kg)  Height: 5' 5 (1.651 m)   Body mass index is 38.94 kg/m.  Physical Exam Vitals and nursing note reviewed. Exam conducted with a chaperone present.  Constitutional:      Appearance: Normal appearance. She is obese.  HENT:     Head: Normocephalic and atraumatic.  Neck:     Thyroid: No thyroid mass, thyromegaly or thyroid tenderness.  Cardiovascular:     Rate and Rhythm: Regular rhythm.     Heart sounds: Normal heart sounds.   Pulmonary:     Effort: Pulmonary effort is normal.     Breath sounds: Normal breath sounds.  Chest:  Breasts:    Breasts are symmetrical.     Right: Normal. No inverted nipple, mass, nipple discharge, skin change or tenderness.     Left: Normal. No inverted nipple, mass, nipple discharge, skin change or tenderness.  Abdominal:     General: Abdomen is flat. Bowel sounds are normal.     Palpations: Abdomen is soft.  Genitourinary:    General: Normal vulva.     Vagina: Normal. No vaginal discharge, bleeding or lesions.     Cervix: Normal. No discharge or lesion.     Uterus: Normal. Not enlarged and not tender.      Adnexa: Right adnexa normal and left adnexa normal.       Right: No mass, tenderness or fullness.         Left: No mass, tenderness or fullness.    Lymphadenopathy:     Upper Body:     Right upper body: No axillary adenopathy.     Left upper body: No axillary adenopathy.  Skin:    General: Skin is warm and dry.  Neurological:     Mental Status: She is alert and oriented to person, place, and time.  Psychiatric:        Mood and Affect: Mood normal.        Thought Content: Thought content normal.        Judgment: Judgment normal.  Darice Hoit, CMA present for exam  Assessment/Plan:   1. Well woman exam with routine gynecological exam (Primary) - Cytology - PAP( [place])  2. Colon cancer screening - Cologuard  3. Depression screen     Return in about 1 year (around 03/03/2025) for Annual.  GINETTE COZIER B WHNP-BC 10:07 AM 03/03/2024

## 2024-03-04 LAB — CYTOLOGY - PAP
Adequacy: ABSENT
Diagnosis: NEGATIVE

## 2024-03-07 ENCOUNTER — Ambulatory Visit: Admitting: Family Medicine

## 2024-03-07 ENCOUNTER — Encounter: Payer: Self-pay | Admitting: Family Medicine

## 2024-03-07 VITALS — BP 136/84 | HR 65 | Temp 97.8°F | Ht 65.0 in | Wt 235.0 lb

## 2024-03-07 DIAGNOSIS — Z973 Presence of spectacles and contact lenses: Secondary | ICD-10-CM | POA: Insufficient documentation

## 2024-03-07 DIAGNOSIS — I10 Essential (primary) hypertension: Secondary | ICD-10-CM | POA: Insufficient documentation

## 2024-03-07 DIAGNOSIS — N83201 Unspecified ovarian cyst, right side: Secondary | ICD-10-CM | POA: Insufficient documentation

## 2024-03-07 DIAGNOSIS — Z87898 Personal history of other specified conditions: Secondary | ICD-10-CM | POA: Insufficient documentation

## 2024-03-07 DIAGNOSIS — R350 Frequency of micturition: Secondary | ICD-10-CM | POA: Insufficient documentation

## 2024-03-07 DIAGNOSIS — Z7689 Persons encountering health services in other specified circumstances: Secondary | ICD-10-CM | POA: Diagnosis not present

## 2024-03-07 DIAGNOSIS — R55 Syncope and collapse: Secondary | ICD-10-CM | POA: Insufficient documentation

## 2024-03-07 MED ORDER — METOPROLOL SUCCINATE ER 50 MG PO TB24: 50.0000 mg | ORAL_TABLET | Freq: Every day | ORAL | 1 refills | Status: AC

## 2024-03-07 NOTE — Assessment & Plan Note (Signed)
 Blood pressure is stable. Continue Metoprolol  ER 50mg  daily. Refilled medication.

## 2024-03-07 NOTE — Patient Instructions (Addendum)
-  It was nice to meet you and look forward to taking care of you.  -Continue all prescribed medications. Refilled Metoprolol .  -Recommend Tdap (tetanus) vaccine, Hep B vaccine, influenza vaccine, pneumonia vaccine, and zoster vaccine.  -Follow up in 3 months for a physical. Please be fasting at next appointment to have labs drawn.

## 2024-03-07 NOTE — Progress Notes (Signed)
 New Patient Office Visit  Subjective   Patient ID: Shelly Carlson, female    DOB: 30-Jan-1971  Age: 53 y.o. MRN: [id number]  CC:  Chief Complaint  Patient presents with   Establish Care    HPI Shelly Carlson presents to establish care with new provider.  Patients previous primary care provider: [facility] with Dr. Zachary Osei-Bonsu. Last visit: December 2024 or January 2025.   Specialist: [facility] with Shasta Bland   HTN: Chronic. Patient is prescribed Metoprolol  50mg  daily. She reports she was started on this particular medication because she also had a major anxiety attack back in 2015. She reports she monitors her blood pressure at home. Ranging 135-145/mainly 80s to 95. Denies CP, SHOB, HA, dizziness, and lower extremity edema. Becomes lightheadedness sometimes, usually around the time of ovulation.   Outpatient Encounter Medications as of 03/07/2024  Medication Sig   Collagen-Vitamin C-Biotin (COLLAGEN PO) Take by mouth.   Multiple Vitamins-Minerals (MULTIVITAMIN WITH MINERALS) tablet Take 1 tablet by mouth daily.   [DISCONTINUED] metoprolol  succinate (TOPROL -XL) 50 MG 24 hr tablet Take 50 mg by mouth at bedtime. Take with or immediately following a meal.   metoprolol  succinate (TOPROL -XL) 50 MG 24 hr tablet Take 1 tablet (50 mg total) by mouth at bedtime. Take with or immediately following a meal.   [DISCONTINUED] HYDROmorphone  (DILAUDID ) 2 MG tablet Take 1 tablet (2 mg total) by mouth every 4 (four) hours as needed for severe pain. (Patient not taking: Reported on 03/03/2024)   [DISCONTINUED] ibuprofen  (ADVIL ,MOTRIN ) 800 MG tablet Take 1 tablet (800 mg total) by mouth every 8 (eight) hours as needed. (Patient not taking: Reported on 03/03/2024)   [DISCONTINUED] Sodium & Potassium Bicarbonate (ALKA-AID) 360-180 MG TABS Take 1 tablet by mouth daily as needed (flu-like symptoms). (Patient not taking: Reported on 03/03/2024)   No  facility-administered encounter medications on file as of 03/07/2024.    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 and vagal maneuvers instructions)   Hypertension    Leiomyoma    Right ovarian cyst    Wears glasses     Past Surgical History:  Procedure Laterality Date   MYOMECTOMY N/A 01/25/2018   Procedure: ABDOMINAL MYOMECTOMY;  Surgeon: Rockney Evalene SQUIBB, MD;  Location: [facility];  Service: Gynecology;  Laterality: N/A;   OVARIAN CYST REMOVAL Right 01/25/2018   Procedure: OVARIAN CYSTECTOMY;  Surgeon: Rockney Evalene SQUIBB, MD;  Location: [facility];  Service: Gynecology;  Laterality: Right;    Family History  Problem Relation Age of Onset   Hypertension Mother    Diabetes Mother    Pneumonia Mother    Atrial fibrillation Mother    Lung disease Mother    ALS Father    Healthy Brother    High Cholesterol Maternal Grandmother    Asthma Maternal Grandmother    Healthy Maternal Grandfather    Heart attack Paternal Grandmother    Arthritis Paternal Grandmother    Alcohol abuse Paternal Grandfather     Social History   Socioeconomic History   Marital status: Divorced    Spouse name: Not on file   Number of children: 0   Years of education: Not on file   Highest education level: Master's degree (e.g., MA, MS, MEng, MEd, MSW,  MBA)  Occupational History   Not on file  Tobacco Use   Smoking status: Never    Passive exposure: Never   Smokeless tobacco: Never  Vaping Use   Vaping status: Never Used  Substance and Sexual Activity   Alcohol use: Yes    Comment: Once a month   Drug use: No   Sexual activity: Not Currently    Partners: Male    Birth control/protection: None    Comment: menarche 53yo, 1st intercourse 53 yo  Other Topics Concern   Not on file  Social  History Narrative   Not on file   Social Drivers of Health   Financial Resource Strain: Low Risk  (03/07/2024)   Overall Financial Resource Strain (CARDIA)    Difficulty of Paying Living Expenses: Not hard at all  Food Insecurity: No Food Insecurity (03/07/2024)   Hunger Vital Sign    Worried About Running Out of Food in the Last Year: Never true    Ran Out of Food in the Last Year: Never true  Transportation Needs: No Transportation Needs (03/07/2024)   PRAPARE - Administrator, Civil Service (Medical): No    Lack of Transportation (Non-Medical): No  Physical Activity: Insufficiently Active (03/07/2024)   Exercise Vital Sign    Days of Exercise per Week: 4 days    Minutes of Exercise per Session: 30 min  Stress: No Stress Concern Present (03/07/2024)   [employer] of Occupational Health - Occupational Stress Questionnaire    Feeling of Stress: Only a little  Social Connections: Moderately Integrated (03/07/2024)   Social Connection and Isolation Panel    Frequency of Communication with Friends and Family: More than three times a week    Frequency of Social Gatherings with Friends and Family: Twice a week    Attends Religious Services: More than 4 times per year    Active Member of [employer] or Organizations: No    Attends Banker Meetings: Never    Marital Status: Married  Catering manager Violence: Not At Risk (03/07/2024)   Humiliation, Afraid, Rape, and Kick questionnaire    Fear of Current or Ex-Partner: No    Emotionally Abused: No    Physically Abused: No    Sexually Abused: No    ROS See HPI above    Objective  BP 136/84   Pulse 65   Temp 97.8 F (36.6 C) (Oral)   Ht 5' 5 (1.651 m)   Wt 235 lb (106.6 kg)   LMP 01/11/2024 (Exact Date)   SpO2 95%   BMI 39.11 kg/m   Physical Exam Vitals reviewed.  Constitutional:      General: She is not in acute distress.    Appearance: Normal appearance. She is obese. She is not ill-appearing,  toxic-appearing or diaphoretic.  HENT:     Head: Normocephalic and atraumatic.  Eyes:     General:        Right eye: No discharge.        Left eye: No discharge.     Conjunctiva/sclera: Conjunctivae normal.  Cardiovascular:     Rate and Rhythm: Normal rate and regular rhythm.     Heart sounds: Normal heart sounds. No murmur heard.    No friction rub. No gallop.  Pulmonary:     Effort: Pulmonary effort is normal. No respiratory distress.     Breath sounds: Normal breath sounds.  Musculoskeletal:        General: Normal range of motion.  Skin:  General: Skin is warm and dry.  Neurological:     General: No focal deficit present.     Mental Status: She is alert and oriented to person, place, and time. Mental status is at baseline.  Psychiatric:        Mood and Affect: Mood normal.        Behavior: Behavior normal.        Thought Content: Thought content normal.        Judgment: Judgment normal.      Assessment & Plan:  Hypertension, unspecified type Assessment & Plan: Blood pressure is stable. Continue Metoprolol  ER 50mg  daily. Refilled medication.   Orders: -     Metoprolol  Succinate ER; Take 1 tablet (50 mg total) by mouth at bedtime. Take with or immediately following a meal.  Dispense: 90 tablet; Refill: 1  Encounter to establish care  1.Review health maintenance:  -Cologuard: Ordered by GYN on 08/14.  -Covid booster: Not had since 2023  -Tdap vaccine: Recommend  -Hep B vaccine: Recommend  -Hep C and HIV: Order at physical.  -Influenza vaccine: Will obtain later in year  -Mammogram: [facility] in July 2025  -PNA vaccine: Recommend -Zoster vaccine: Recommend  2.Advised patient to be fasting at next appointment for labs.  Return in about 3 months (around 06/07/2024) for physical.   Boruch Manuele, NP

## 2024-03-08 ENCOUNTER — Ambulatory Visit: Payer: Self-pay | Admitting: Radiology

## 2024-05-20 ENCOUNTER — Encounter (HOSPITAL_COMMUNITY): Payer: Self-pay

## 2024-05-20 ENCOUNTER — Ambulatory Visit (HOSPITAL_COMMUNITY)
Admission: RE | Admit: 2024-05-20 | Discharge: 2024-05-20 | Disposition: A | Discharge status: Home / Self Care | Source: Ambulatory Visit | Attending: Emergency Medicine | Admitting: Emergency Medicine

## 2024-05-20 ENCOUNTER — Ambulatory Visit: Payer: Self-pay

## 2024-05-20 VITALS — BP 140/77 | HR 68 | Temp 99.0°F | Resp 18

## 2024-05-20 DIAGNOSIS — J4521 Mild intermittent asthma with (acute) exacerbation: Secondary | ICD-10-CM

## 2024-05-20 DIAGNOSIS — R062 Wheezing: Secondary | ICD-10-CM

## 2024-05-20 DIAGNOSIS — J309 Allergic rhinitis, unspecified: Secondary | ICD-10-CM | POA: Diagnosis not present

## 2024-05-20 MED ORDER — DESLORATADINE 5 MG PO TABS: 5.0000 mg | ORAL_TABLET | Freq: Every day | ORAL | 2 refills | Status: AC

## 2024-05-20 MED ORDER — METHYLPREDNISOLONE SODIUM SUCC 125 MG IJ SOLR
80.0000 mg | Freq: Once | INTRAMUSCULAR | Status: AC
  Administered 2024-05-20: 80 mg via INTRAMUSCULAR

## 2024-05-20 MED ORDER — PROMETHAZINE-DM 6.25-15 MG/5ML PO SYRP: 5.0000 mL | ORAL_SOLUTION | Freq: Every evening | ORAL | 0 refills | Status: AC | PRN

## 2024-05-20 MED ORDER — METHYLPREDNISOLONE 4 MG PO TBPK: ORAL_TABLET | ORAL | 0 refills | Status: DC

## 2024-05-20 MED ORDER — MOMETASONE FUROATE 50 MCG/ACT NA SUSP: 2.0000 | Freq: Every day | NASAL | 2 refills | Status: AC

## 2024-05-20 MED ORDER — METHYLPREDNISOLONE SODIUM SUCC 125 MG IJ SOLR
INTRAMUSCULAR | Status: AC
  Filled 2024-05-20: qty 2

## 2024-05-20 MED ORDER — ALBUTEROL SULFATE HFA 108 (90 BASE) MCG/ACT IN AERS: 2.0000 | INHALATION_SPRAY | Freq: Four times a day (QID) | RESPIRATORY_TRACT | 2 refills | Status: AC | PRN

## 2024-05-20 NOTE — Discharge Instructions (Signed)
 Please read below to learn more about the medications, dosages and frequencies that I recommend to help alleviate your symptoms and to get you feeling better soon:   Your symptoms and my physical exam findings are concerning for exacerbation of your underlying allergies and asthma.                   Please read below to learn more about the medications, dosages and frequencies that I recommend to help alleviate your symptoms and to get you feeling better soon:                      Solu-Medrol IM (methylprednisolone):  To quickly address your significant respiratory inflammation, you were provided with an injection of Solu-Medrol in the office today.  You should continue to feel the full benefit of the steroid for the next 4 to 6 hours.                 Medrol Dosepak (methylprednisolone): This is a steroid that will significantly calm your upper and lower airways.  Please take one row of tablets daily with your breakfast meal starting tomorrow morning until the prescription is complete.           Clarinex (loratadine): This is an excellent second-generation antihistamine that helps to reduce respiratory inflammatory response to environmental allergens.  This medication is not known to cause daytime sleepiness so it can be taken in the daytime.  If you find that it does make you sleepy, please feel free to take it at bedtime.                               Nasonex (mometasone): This is a steroid nasal spray that used once daily, 1 spray in each nare.  This works best when used on a daily basis. This medication does not work well if it is only used when you think you need it.  After 3 to 5 days of use, you will notice significant reduction of the inflammation and mucus production that is currently being caused by exposure to allergens, whether seasonal or environmental.  The most common side effect of this medication is nosebleeds.  If you experience a nosebleed, please discontinue use for 1 week, then feel  free to resume.   If you find that your insurance will not pay for this medication, please consider a different nasal steroids such as Flonase (fluticasone), or Nasacort (triamcinolone).        ProAir, Ventolin, Proventil (albuterol): This inhaled medication contains a short acting beta agonist bronchodilator.  This medication relaxes the smooth muscle of the airway in the lungs.  When these muscles are tight, breathing becomes more constricted.  The result of relaxation of the smooth muscle is increased air movement and improved work of breathing.  This is a short acting medication that can be used every 4-6 hours as needed for increased work of breathing, shortness of breath, wheezing and excessive coughing.  It comes in the form of a handheld inhaler or nebulizer solution.  I recommended that for the next 3 to 4 days, this medication is used 4 times daily on a scheduled basis then decrease to twice daily and as needed until symptoms have completely resolved which I anticipate will be several weeks.   Promethazine DM: Promethazine is both a nasal decongestant that dries up mucous membranes and an antinausea medication.  Promethazine often makes  most patients feel fairly sleepy.  DM is dextromethorphan, a single symptom reliever which is a cough suppressant found in many over-the-counter cough medications and combination cold preparations.  Please take 5 mL before bedtime to minimize your cough which will help you sleep better.  I have sent a prescription for this medication to your pharmacy because it cannot be purchased over-the-counter.   If symptoms have not meaningfully improved in the next 5 to 7 days, please return for repeat evaluation or follow-up with your regular provider.  If symptoms have worsened in the next 3 to 5 days, please go to the emergency room for further evaluation.    Thank you for visiting urgent care today.  We appreciate the opportunity to participate in your care.

## 2024-05-20 NOTE — ED Provider Notes (Signed)
 [facility]    CSN: [id number] Arrival date & time: 05/20/24  1825    HISTORY   Chief Complaint  Patient presents with   Cough    I have had a persistent cough since October 10th.  It intensifies in the mornings and night, sleeping upright(still coughing).  Throwing up clear mucus some mornings/some nights due to the persistent coughing, dependent on environment, runny nose, - Entered by patient   HPI Shelly Carlson is a pleasant, 53 y.o. female who presents to urgent care today. Patient complains of a 20-day history of cough.  Patient states the cough was so bad last night that she began to vomit.  Patient states she can also hear slight wheeze when she is lying down to sleep at night.  Patient states the mucus she is producing occasionally is clear and heavier in the morning and throughout the day.  Patient states that changing her environment from indoors to cooler outdoors does tend to worsen her cough.  Patient endorses a history of allergies, states not currently taking an anti-histamine regularly but did take Zyrtec yesterday which she states did not help.  Patient denies history of asthma states her cough has been making her feel short of breath.  Patient has elevated blood pressure on arrival, reports compliance with metoprolol .  The history is provided by the patient.  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 and vagal maneuvers instructions)   Hypertension    Leiomyoma    Right ovarian cyst    Wears glasses    Patient Active Problem List   Diagnosis Date Noted   Fainting spell    Frequency of urination    History of palpitations    Hypertension    Right ovarian cyst    Wears glasses    Leiomyoma 01/25/2018   Past Surgical History:  Procedure Laterality Date   MYOMECTOMY N/A  01/25/2018   Procedure: ABDOMINAL MYOMECTOMY;  Surgeon: Rockney Evalene SQUIBB, MD;  Location: [facility];  Service: Gynecology;  Laterality: N/A;   OVARIAN CYST REMOVAL Right 01/25/2018   Procedure: OVARIAN CYSTECTOMY;  Surgeon: Rockney Evalene SQUIBB, MD;  Location: [facility];  Service: Gynecology;  Laterality: Right;   OB History     Gravida  0   Para  0   Term  0   Preterm  0   AB  0   Living  0      SAB  0   IAB  0   Ectopic  0   Multiple  0   Live Births  0          Home Medications    Prior to Admission medications   Medication Sig Start Date End Date Taking? Authorizing Provider  albuterol (VENTOLIN HFA) 108 (90 Base) MCG/ACT inhaler Inhale 2 puffs into the lungs every 6 (six) hours as needed for wheezing or shortness of breath (Cough). 05/20/24  Yes Joesph Shaver Scales, PA-C  desloratadine (CLARINEX) 5 MG tablet Take 1 tablet (5 mg total) by mouth daily. 05/20/24 08/18/24 Yes Joesph Shaver Scales, PA-C  methylPREDNISolone (MEDROL DOSEPAK) 4 MG TBPK tablet Take 24 mg on day 1, 20 mg on day 2, 16 mg on day 3, 12 mg on day 4, 8 mg on day  5, 4 mg on day 6.  Take all tablets in each row at once, do not spread tablets out throughout the day. 05/20/24  Yes Joesph Shaver Scales, PA-C  mometasone (NASONEX) 50 MCG/ACT nasal spray Place 2 sprays into the nose daily. 05/20/24 08/18/24 Yes Joesph Shaver Scales, PA-C  promethazine-dextromethorphan (PROMETHAZINE-DM) 6.25-15 MG/5ML syrup Take 5 mLs by mouth at bedtime as needed for cough. 05/20/24  Yes Joesph Shaver Scales, PA-C  Collagen-Vitamin C-Biotin (COLLAGEN PO) Take by mouth.    [provider]  metoprolol  succinate (TOPROL -XL) 50 MG 24 hr tablet Take 1 tablet (50 mg total) by mouth at bedtime. Take with or immediately following a meal. 03/07/24   Billy Philippe SAUNDERS, NP  Multiple Vitamins-Minerals (MULTIVITAMIN WITH MINERALS) tablet Take 1 tablet by mouth daily.    [provider]    Family History Family History  Problem Relation Age of Onset   Hypertension Mother    Diabetes Mother    Pneumonia Mother    Atrial fibrillation Mother    Lung disease Mother    ALS Father    Healthy Brother    High Cholesterol Maternal Grandmother    Asthma Maternal Grandmother    Healthy Maternal Grandfather    Heart attack Paternal Grandmother    Arthritis Paternal Grandmother    Alcohol abuse Paternal Grandfather    Social History Social History   Tobacco Use   Smoking status: Never    Passive exposure: Never   Smokeless tobacco: Never  Vaping Use   Vaping status: Never Used  Substance Use Topics   Alcohol use: Yes    Comment: Once a month   Drug use: No   Allergies   Codeine and Oxycodone  Review of Systems Review of Systems  Respiratory:  Positive for cough.    Pertinent findings revealed after performing a 14 point review of systems has been noted in the history of present illness.  Physical Exam Vital Signs BP (!) 140/77 (BP Location: Left Arm)   Pulse 68   Temp 99 F (37.2 C) (Oral)   Resp 18   LMP  (Exact Date)   SpO2 96%   No data found.  Physical Exam Vitals and nursing note reviewed.  Constitutional:      General: She is awake. She is not in acute distress.    Appearance: Normal appearance. She is well-developed and well-groomed. She is not ill-appearing.  HENT:     Head: Normocephalic and atraumatic.     Salivary Glands: Right salivary gland is not diffusely enlarged or tender. Left salivary gland is not diffusely enlarged or tender.     Right Ear: Hearing, ear canal and external ear normal. A middle ear effusion is present. Tympanic membrane is bulging. Tympanic membrane is not injected or erythematous.     Left Ear: Hearing, ear canal and external ear normal. A middle ear effusion is present. Tympanic membrane is bulging. Tympanic membrane is not injected or erythematous.     Ears:     Comments: Bilateral EACs normal,  both TMs bulging with clear fluid    Nose: Rhinorrhea present. No nasal deformity, septal deviation, signs of injury or nasal tenderness. Rhinorrhea is clear.     Right Nostril: No foreign body, epistaxis, septal hematoma or occlusion.     Left Nostril: No foreign body, epistaxis, septal hematoma or occlusion.     Right Turbinates: Enlarged, swollen and pale.     Left Turbinates: Enlarged, swollen and pale.  Right Sinus: No maxillary sinus tenderness or frontal sinus tenderness.     Left Sinus: No maxillary sinus tenderness or frontal sinus tenderness.     Mouth/Throat:     Lips: Pink. No lesions.     Mouth: Mucous membranes are moist. No oral lesions.     Tongue: No lesions. Tongue does not deviate from midline.     Palate: No mass and lesions.     Pharynx: Oropharynx is clear. Uvula midline. Postnasal drip present. No pharyngeal swelling, oropharyngeal exudate, posterior oropharyngeal erythema or uvula swelling.     Tonsils: No tonsillar exudate. 0 on the right. 0 on the left.     Comments: Postnasal drip Eyes:     General: Lids are normal.        Right eye: No discharge.        Left eye: No discharge.     Conjunctiva/sclera: Conjunctivae normal.     Right eye: Right conjunctiva is not injected.     Left eye: Left conjunctiva is not injected.  Neck:     Trachea: Trachea and phonation normal.  Cardiovascular:     Rate and Rhythm: Normal rate and regular rhythm.  Pulmonary:     Effort: Pulmonary effort is normal.     Breath sounds: Examination of the right-upper field reveals wheezing. Examination of the right-middle field reveals wheezing. Wheezing present.  Chest:     Chest wall: No tenderness.  Musculoskeletal:        General: Normal range of motion.     Cervical back: Full passive range of motion without pain, normal range of motion and neck supple. Normal range of motion.  Lymphadenopathy:     Cervical: No cervical adenopathy.  Skin:    General: Skin is warm and dry.      Findings: No erythema or rash.  Neurological:     General: No focal deficit present.     Mental Status: She is alert and oriented to person, place, and time. Mental status is at baseline.  Psychiatric:        Attention and Perception: Attention and perception normal.        Mood and Affect: Mood and affect normal.        Speech: Speech normal.        Behavior: Behavior normal. Behavior is cooperative.        Thought Content: Thought content normal.     Visual Acuity Right Eye Distance:   Left Eye Distance:   Bilateral Distance:    Right Eye Near:   Left Eye Near:    Bilateral Near:     UC Couse / Diagnostics / Procedures:     Radiology No results found.  Procedures Procedures (including critical care time) EKG  Pending results:  Labs Reviewed - No data to display  Medications Ordered in UC: Medications  methylPREDNISolone sodium succinate (SOLU-MEDROL) 125 mg/2 mL injection 80 mg (80 mg Intramuscular Given 05/20/24 1940)    UC Diagnoses / Final Clinical Impressions(s)   I have reviewed the triage vital signs and the nursing notes.  Pertinent labs & imaging results that were available during my care of the patient were reviewed by me and considered in my medical decision making (see chart for details).    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  Patient advised that given finding of  wheezing on auscultation during her visit today I recommend steroids to calm her lower as well as her upper respiratory tract at this time.  Patient provided with an injection of Solu-Medrol during her visit and a tapering dose of methylprednisolone.  Patient also provided with prescription for albuterol inhaler that she has been advised to use 4 times daily for the next several days or until coughing is  resolved.    Patient encouraged to continue daily second-generation antihistamine, patient states she does not have any more Zyrtec at home so I provided her with a prescription for Clarinex which her insurance will pay for.  Patient also provided with a prescription for Nasonex which is also covered by her insurance.  Patient advised to use both daily throughout the winter season.    While these medications are being on boarded, patient provided with Promethazine DM to calm nighttime cough so that she can get some sleep.  Please see discharge instructions below for details of plan of care as provided to patient. ED Prescriptions     Medication Sig Dispense Auth. Provider   desloratadine (CLARINEX) 5 MG tablet Take 1 tablet (5 mg total) by mouth daily. 30 tablet Joesph Shaver Scales, PA-C   mometasone (NASONEX) 50 MCG/ACT nasal spray Place 2 sprays into the nose daily. 1 each Joesph Shaver Scales, PA-C   methylPREDNISolone (MEDROL DOSEPAK) 4 MG TBPK tablet Take 24 mg on day 1, 20 mg on day 2, 16 mg on day 3, 12 mg on day 4, 8 mg on day 5, 4 mg on day 6.  Take all tablets in each row at once, do not spread tablets out throughout the day. 21 tablet Joesph Shaver Scales, PA-C   promethazine-dextromethorphan (PROMETHAZINE-DM) 6.25-15 MG/5ML syrup Take 5 mLs by mouth at bedtime as needed for cough. 60 mL Joesph Shaver Scales, PA-C   albuterol (VENTOLIN HFA) 108 (90 Base) MCG/ACT inhaler Inhale 2 puffs into the lungs every 6 (six) hours as needed for wheezing or shortness of breath (Cough). 18 g Joesph Shaver Scales, PA-C      PDMP not reviewed this encounter.  Pending results:  Labs Reviewed - No data to display    Discharge Instructions      Please read below to learn more about the medications, dosages and frequencies that I recommend to help alleviate your symptoms and to get you feeling better soon:   Your symptoms and my physical exam findings are concerning for exacerbation  of your underlying allergies and asthma.                   Please read below to learn more about the medications, dosages and frequencies that I recommend to help alleviate your symptoms and to get you feeling better soon:                      Solu-Medrol IM (methylprednisolone):  To quickly address your significant respiratory inflammation, you were provided with an injection of Solu-Medrol in the office today.  You should continue to feel the full benefit of the steroid for the next 4 to 6 hours.                 Medrol Dosepak (methylprednisolone): This is a steroid that will significantly calm your upper and lower airways.  Please take one row of tablets daily with your breakfast meal starting tomorrow morning until the prescription is complete.  Clarinex (loratadine): This is an excellent second-generation antihistamine that helps to reduce respiratory inflammatory response to environmental allergens.  This medication is not known to cause daytime sleepiness so it can be taken in the daytime.  If you find that it does make you sleepy, please feel free to take it at bedtime.                               Nasonex (mometasone): This is a steroid nasal spray that used once daily, 1 spray in each nare.  This works best when used on a daily basis. This medication does not work well if it is only used when you think you need it.  After 3 to 5 days of use, you will notice significant reduction of the inflammation and mucus production that is currently being caused by exposure to allergens, whether seasonal or environmental.  The most common side effect of this medication is nosebleeds.  If you experience a nosebleed, please discontinue use for 1 week, then feel free to resume.   If you find that your insurance will not pay for this medication, please consider a different nasal steroids such as Flonase (fluticasone), or Nasacort (triamcinolone).        ProAir, Ventolin, Proventil (albuterol): This  inhaled medication contains a short acting beta agonist bronchodilator.  This medication relaxes the smooth muscle of the airway in the lungs.  When these muscles are tight, breathing becomes more constricted.  The result of relaxation of the smooth muscle is increased air movement and improved work of breathing.  This is a short acting medication that can be used every 4-6 hours as needed for increased work of breathing, shortness of breath, wheezing and excessive coughing.  It comes in the form of a handheld inhaler or nebulizer solution.  I recommended that for the next 3 to 4 days, this medication is used 4 times daily on a scheduled basis then decrease to twice daily and as needed until symptoms have completely resolved which I anticipate will be several weeks.   Promethazine DM: Promethazine is both a nasal decongestant that dries up mucous membranes and an antinausea medication.  Promethazine often makes most patients feel fairly sleepy.  DM is dextromethorphan, a single symptom reliever which is a cough suppressant found in many over-the-counter cough medications and combination cold preparations.  Please take 5 mL before bedtime to minimize your cough which will help you sleep better.  I have sent a prescription for this medication to your pharmacy because it cannot be purchased over-the-counter.   If symptoms have not meaningfully improved in the next 5 to 7 days, please return for repeat evaluation or follow-up with your regular provider.  If symptoms have worsened in the next 3 to 5 days, please go to the emergency room for further evaluation.    Thank you for visiting urgent care today.  We appreciate the opportunity to participate in your care.     Disposition Upon Discharge:  Condition: stable for discharge home  Patient presented with an acute illness with associated systemic symptoms and significant discomfort requiring urgent management. In my opinion, this is a condition that a  prudent lay person (someone who possesses an average knowledge of health and medicine) may potentially expect to result in complications if not addressed urgently such as respiratory distress, impairment of bodily function or dysfunction of bodily organs.   Routine symptom specific, illness specific and/or disease  specific instructions were discussed with the patient and/or caregiver at length.   As such, the patient has been evaluated and assessed, work-up was performed and treatment was provided in alignment with urgent care protocols and evidence based medicine.  Patient/parent/caregiver has been advised that the patient may require follow up for further testing and treatment if the symptoms continue in spite of treatment, as clinically indicated and appropriate.  Patient/parent/caregiver has been advised to return to the [facility] or PCP if no better; to PCP or the Emergency Department if new signs and symptoms develop, or if the current signs or symptoms continue to change or worsen for further workup, evaluation and treatment as clinically indicated and appropriate  The patient will follow up with their current PCP if and as advised. If the patient does not currently have a PCP we will assist them in obtaining one.   The patient may need specialty follow up if the symptoms continue, in spite of conservative treatment and management, for further workup, evaluation, consultation and treatment as clinically indicated and appropriate.  Patient/parent/caregiver verbalized understanding and agreement of plan as discussed.  All questions were addressed during visit.  Please see discharge instructions below for further details of plan.  This office note has been dictated using Teaching laboratory technician.  Unfortunately, this method of dictation can sometimes lead to typographical or grammatical errors.  I apologize for your inconvenience in advance if this occurs.  Please do not hesitate to reach out  to me if clarification is needed.      Joesph Shaver Scales, [place] 05/22/24 [phone]

## 2024-05-20 NOTE — Telephone Encounter (Signed)
 FYI Only or Action Required?: FYI only for provider: Pt going to urgent care tomorrow.  Patient was last seen in primary care on 03/07/2024 by Shelly Philippe SAUNDERS, NP.  Called Nurse Triage reporting Cough.  Symptoms began several weeks ago.  Interventions attempted: Nothing.  Symptoms are: gradually worsening.  Triage Disposition: See PCP When Office is Open (Within 3 Days)  Patient/caregiver understands and will follow disposition?: Yes- Patient prefers visit today or tomorrow. No appts avail PCP office until 11/3.  RN offered appt at [facility], pt declined and opted for urgent care instead.  Care advice given. Pt expresses no other concerns at this ime.   Copied from CRM #[id number]. Topic: Clinical - Red Word Triage >> May 20, 2024  8:30 AM Macario HERO wrote: Red Word that prompted transfer to Nurse Triage: Patient stated she's been coughing, and throwing up mucus Reason for Disposition  Cough has been present for > 3 weeks  Answer Assessment - Initial Assessment Questions 1. ONSET: When did the cough begin?      October 5th  2. SEVERITY: How bad is the cough today?      Gets worse at night and first thing in the morning  3. SPUTUM: Describe the color of your sputum (e.g., none, dry cough; clear, white, yellow, green)     Clear mucus  4. HEMOPTYSIS: Are you coughing up any blood? If Yes, ask: How much? (e.g., flecks, streaks, tablespoons, etc.)     No  5. DIFFICULTY BREATHING: Are you having difficulty breathing? If Yes, ask: How bad is it? (e.g., mild, moderate, severe)      Can hear some wheezing sometimes at night. Does not have SOB, but can hear the congestion rattling  6. FEVER: Do you have a fever? If Yes, ask: What is your temperature, how was it measured, and when did it start?     No  7. CARDIAC HISTORY: Do you have any history of heart disease? (e.g., heart attack, congestive heart failure)      No  8. LUNG HISTORY: Do you have any  history of lung disease?  (e.g., pulmonary embolus, asthma, emphysema)     No  9. PE RISK FACTORS: Do you have a history of blood clots? (or: recent major surgery, recent prolonged travel, bedridden)     No  10. OTHER SYMPTOMS: Do you have any other symptoms? (e.g., runny nose, wheezing, chest pain)       Mild wheezing, nasal congestion, vomiting when coughing  11. PREGNANCY: Is there any chance you are pregnant? When was your last menstrual period?       No  12. TRAVEL: Have you traveled out of the country in the last month? (e.g., travel history, exposures)  Protocols used: Cough - Acute Productive-A-AH

## 2024-05-20 NOTE — ED Triage Notes (Signed)
 Pt c/o cough since 10/10. States worse last night and vomiting d/t coughing. States can hear a slight wheeze at night.

## 2024-06-07 ENCOUNTER — Encounter: Payer: Self-pay | Admitting: Family Medicine

## 2024-06-07 ENCOUNTER — Ambulatory Visit (INDEPENDENT_AMBULATORY_CARE_PROVIDER_SITE_OTHER): Admitting: Family Medicine

## 2024-06-07 VITALS — BP 124/78 | HR 75 | Temp 98.6°F | Ht 65.0 in | Wt 233.0 lb

## 2024-06-07 DIAGNOSIS — Z1159 Encounter for screening for other viral diseases: Secondary | ICD-10-CM

## 2024-06-07 DIAGNOSIS — H6123 Impacted cerumen, bilateral: Secondary | ICD-10-CM

## 2024-06-07 DIAGNOSIS — Z6838 Body mass index (BMI) 38.0-38.9, adult: Secondary | ICD-10-CM | POA: Diagnosis not present

## 2024-06-07 DIAGNOSIS — Z0000 Encounter for general adult medical examination without abnormal findings: Secondary | ICD-10-CM | POA: Diagnosis not present

## 2024-06-07 DIAGNOSIS — E66812 Obesity, class 2: Secondary | ICD-10-CM | POA: Diagnosis not present

## 2024-06-07 DIAGNOSIS — R5383 Other fatigue: Secondary | ICD-10-CM | POA: Diagnosis not present

## 2024-06-07 DIAGNOSIS — Z114 Encounter for screening for human immunodeficiency virus [HIV]: Secondary | ICD-10-CM

## 2024-06-07 DIAGNOSIS — E559 Vitamin D deficiency, unspecified: Secondary | ICD-10-CM

## 2024-06-07 DIAGNOSIS — I10 Essential (primary) hypertension: Secondary | ICD-10-CM

## 2024-06-07 DIAGNOSIS — Z23 Encounter for immunization: Secondary | ICD-10-CM | POA: Diagnosis not present

## 2024-06-07 NOTE — Progress Notes (Signed)
 Complete physical exam  Patient: Shelly Carlson   DOB: Dec 12, 1970   53 y.o. Female  MRN: [id number]  Subjective:    Chief Complaint  Patient presents with   Annual Exam    Shelly Carlson is a 53 y.o. female who presents today for a complete physical exam. She reports consuming a high protein diet. Exercising: 3 times a week to the gym-cardio She generally feels well. She reports sleeping well. She does not have additional problems to discuss today.    Most recent fall risk assessment:    06/07/2024    3:25 PM  Fall Risk   Falls in the past year? 0  Number falls in past yr: 0  Injury with Fall? 0  Risk for fall due to : No Fall Risks  Follow up Falls evaluation completed     Most recent depression screenings:    06/07/2024    3:25 PM 03/07/2024    2:12 PM  PHQ 2/9 Scores  PHQ - 2 Score 0 0  PHQ- 9 Score 0 0      Data saved with a previous flowsheet row definition    Vision:Within last year and Dental: No current dental problems and Receives regular dental care -Will start back with dentist appointment, now that she has insurance.  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 and vagal maneuvers instructions)   Hypertension    Leiomyoma    Right ovarian cyst    Wears glasses    Past Surgical History:  Procedure Laterality Date   MYOMECTOMY N/A 01/25/2018   Procedure: ABDOMINAL MYOMECTOMY;  Surgeon: Rockney Evalene SQUIBB, MD;  Location: [facility];  Service: Gynecology;  Laterality: N/A;   OVARIAN CYST REMOVAL Right 01/25/2018   Procedure: OVARIAN CYSTECTOMY;  Surgeon: Rockney Evalene SQUIBB, MD;  Location: [facility];  Service: Gynecology;  Laterality: Right;   Social History   Tobacco Use   Smoking status: Never    Passive exposure: Never   Smokeless tobacco: Never   Vaping Use   Vaping status: Never Used  Substance Use Topics   Alcohol use: Yes    Comment: Once a month   Drug use: No   Social History   Socioeconomic History   Marital status: Divorced    Spouse name: Not on file   Number of children: 0   Years of education: Not on file   Highest education level: Master's degree (e.g., MA, MS, MEng, MEd, MSW, MBA)  Occupational History   Not on file  Tobacco Use   Smoking status: Never    Passive exposure: Never   Smokeless tobacco: Never  Vaping Use   Vaping status: Never Used  Substance and Sexual Activity   Alcohol use: Yes    Comment: Once a month   Drug use: No   Sexual activity: Not Currently    Partners: Male    Birth control/protection: None    Comment: menarche 53yo, 1st intercourse 53 yo  Other Topics Concern   Not on file  Social History Narrative   Not on file   Social Drivers of Health   Financial Resource Strain: Low Risk  (06/04/2024)   Overall Financial Resource Strain (CARDIA)    Difficulty of Paying Living Expenses: Not hard at all  Food Insecurity: No  Food Insecurity (06/04/2024)   Hunger Vital Sign    Worried About Running Out of Food in the Last Year: Never true    Ran Out of Food in the Last Year: Never true  Transportation Needs: No Transportation Needs (06/04/2024)   PRAPARE - Administrator, Civil Service (Medical): No    Lack of Transportation (Non-Medical): No  Physical Activity: Insufficiently Active (06/04/2024)   Exercise Vital Sign    Days of Exercise per Week: 3 days    Minutes of Exercise per Session: 40 min  Stress: No Stress Concern Present (06/04/2024)   [employer] of Occupational Health - Occupational Stress Questionnaire    Feeling of Stress: Only a little  Social Connections: Moderately Integrated (06/04/2024)   Social Connection and Isolation Panel    Frequency of Communication with Friends and Family: More than three times a week    Frequency of Social  Gatherings with Friends and Family: More than three times a week    Attends Religious Services: More than 4 times per year    Active Member of [employer] or Organizations: Yes    Attends Banker Meetings: 1 to 4 times per year    Marital Status: Divorced  Intimate Partner Violence: Not At Risk (03/07/2024)   Humiliation, Afraid, Rape, and Kick questionnaire    Fear of Current or Ex-Partner: No    Emotionally Abused: No    Physically Abused: No    Sexually Abused: No   Family Status  Relation Name Status   Mother  (Not Specified)   Father  Deceased   Brother  Alive   MGM  Alive   MGF  Deceased   PGM  Deceased   PGF  Deceased  No partnership data on file   Family History  Problem Relation Age of Onset   Hypertension Mother    Diabetes Mother    Pneumonia Mother    Atrial fibrillation Mother    Lung disease Mother    ALS Father    Healthy Brother    High Cholesterol Maternal Grandmother    Asthma Maternal Grandmother    Healthy Maternal Grandfather    Heart attack Paternal Grandmother    Arthritis Paternal Grandmother    Alcohol abuse Paternal Grandfather    Allergies  Allergen Reactions   Codeine Hives   Oxycodone Hives     Patient Care Team: Billy Philippe SAUNDERS, NP as PCP - General (Family Medicine) Chrzanowski, Jami B, NP as Nurse Practitioner (Obstetrics and Gynecology)   Outpatient Medications Prior to Visit  Medication Sig   albuterol  (VENTOLIN  HFA) 108 (90 Base) MCG/ACT inhaler Inhale 2 puffs into the lungs every 6 (six) hours as needed for wheezing or shortness of breath (Cough).   Collagen-Vitamin C-Biotin (COLLAGEN PO) Take by mouth.   desloratadine  (CLARINEX ) 5 MG tablet Take 1 tablet (5 mg total) by mouth daily.   metoprolol  succinate (TOPROL -XL) 50 MG 24 hr tablet Take 1 tablet (50 mg total) by mouth at bedtime. Take with or immediately following a meal.   mometasone  (NASONEX ) 50 MCG/ACT nasal spray Place 2 sprays into the nose daily.    Multiple Vitamins-Minerals (MULTIVITAMIN WITH MINERALS) tablet Take 1 tablet by mouth daily.   promethazine -dextromethorphan (PROMETHAZINE -DM) 6.25-15 MG/5ML syrup Take 5 mLs by mouth at bedtime as needed for cough.   [DISCONTINUED] methylPREDNISolone  (MEDROL  DOSEPAK) 4 MG TBPK tablet Take 24 mg on day 1, 20 mg on day 2, 16 mg on day 3, 12 mg on day  4, 8 mg on day 5, 4 mg on day 6.  Take all tablets in each row at once, do not spread tablets out throughout the day.   No facility-administered medications prior to visit.    ROS See HPI above    Objective:   BP 124/78   Pulse 75   Temp 98.6 F (37 C) (Oral)   Ht 5' 5 (1.651 m)   Wt 233 lb (105.7 kg)   SpO2 97%   BMI 38.77 kg/m    Physical Exam Vitals reviewed.  Constitutional:      General: She is not in acute distress.    Appearance: Normal appearance. She is obese. She is not ill-appearing, toxic-appearing or diaphoretic.  HENT:     Head: Normocephalic and atraumatic.     Right Ear: External ear normal. There is impacted cerumen.     Left Ear: External ear normal. There is impacted cerumen.     Nose:     Right Sinus: No maxillary sinus tenderness or frontal sinus tenderness.     Left Sinus: No maxillary sinus tenderness or frontal sinus tenderness.     Mouth/Throat:     Mouth: Mucous membranes are moist.     Pharynx: Oropharynx is clear. Uvula midline. No pharyngeal swelling, oropharyngeal exudate, posterior oropharyngeal erythema or uvula swelling.  Eyes:     General:        Right eye: No discharge.        Left eye: No discharge.     Conjunctiva/sclera: Conjunctivae normal.     Pupils: Pupils are equal, round, and reactive to light.  Neck:     Thyroid: No thyromegaly.  Cardiovascular:     Rate and Rhythm: Normal rate and regular rhythm.     Pulses:          Posterior tibial pulses are 2+ on the right side and 2+ on the left side.     Heart sounds: Normal heart sounds. No murmur heard.    No friction rub. No gallop.   Pulmonary:     Effort: Pulmonary effort is normal. No respiratory distress.     Breath sounds: Normal breath sounds.  Abdominal:     General: Abdomen is flat. Bowel sounds are normal. There is no distension.     Palpations: Abdomen is soft. There is no mass.     Tenderness: There is no abdominal tenderness. There is no right CVA tenderness or left CVA tenderness.  Musculoskeletal:        General: Normal range of motion.     Cervical back: Normal range of motion.     Right lower leg: No edema.     Left lower leg: No edema.  Lymphadenopathy:     Cervical: No cervical adenopathy.  Skin:    General: Skin is warm and dry.  Neurological:     General: No focal deficit present.     Mental Status: She is alert and oriented to person, place, and time. Mental status is at baseline.     Motor: No weakness.     Gait: Gait normal.  Psychiatric:        Mood and Affect: Mood normal.        Behavior: Behavior normal.        Thought Content: Thought content normal.        Judgment: Judgment normal.    PRE-PROCEDURE EXAM: Left and Right TM cannot be visualized due to partial or total occlusion/impaction of the ear canal. PROCEDURE  INDICATION: Remove wax to visualize ear drum & relieve discomfort CONSENT:  Verbal  PROCEDURE NOTE: Left and Right ear: The CMA, Kyleigh Sigmon, irrigated both ears with warm water and ear drops to remove the wax. 100% of the wax was removed.  POST- PROCEDURE EXAM: TMs successfully visualized. TM with no erythema. The patient tolerated the procedure well.      Assessment & Plan:    Routine Health Maintenance and Physical Exam  Immunization History  Administered Date(s) Administered   PFIZER(Purple Top)SARS-COV-2 Vaccination 09/17/2019, 10/08/2019, 06/12/2020    Health Maintenance  Topic Date Due   HIV Screening  Never done   Hepatitis C Screening  Never done   DTaP/Tdap/Td (1 - Tdap) Never done   Pneumococcal Vaccine: 50+ Years (1 of 2 - PCV) Never done    Hepatitis B Vaccines 19-59 Average Risk (1 of 3 - 19+ 3-dose series) Never done   Zoster Vaccines- Shingrix (1 of 2) Never done   Colonoscopy  Never done   Influenza Vaccine  Never done   COVID-19 Vaccine (4 - 2025-26 season) 03/21/2024   Mammogram  01/26/2026   Cervical Cancer Screening (HPV/Pap Cotest)  03/04/2027   HPV VACCINES  Aged Out   Meningococcal B Vaccine  Aged Out    Discussed health benefits of physical activity, and encouraged her to engage in regular exercise appropriate for her age and condition.  Annual physical exam  Immunization due -     Tdap vaccine greater than or equal to 7yo IM -     Flu vaccine trivalent PF, 6mos and older(Flulaval,Afluria,Fluarix,Fluzone)  Vitamin D  deficiency  Fatigue, unspecified type -     CBC with Differential/Platelet; Future -     TSH; Future -     VITAMIN D  25 Hydroxy (Vit-D Deficiency, Fractures); Future -     Vitamin B12; Future -     Comprehensive metabolic panel with GFR; Future  Encounter for screening for HIV -     HIV Antibody (routine testing w rflx); Future  Need for hepatitis C screening test -     Hepatitis C antibody; Future  Class 2 severe obesity due to excess calories with serious comorbidity and body mass index (BMI) of 38.0 to 38.9 in adult -     CBC with Differential/Platelet; Future -     Hemoglobin A1c; Future -     Lipid panel; Future -     TSH; Future -     VITAMIN D  25 Hydroxy (Vit-D Deficiency, Fractures); Future -     Comprehensive metabolic panel with GFR; Future  Bilateral impacted cerumen  Primary hypertension -     Comprehensive metabolic panel with GFR; Future  1.Review health maintenance:  -Cologuard: Has box at home. Encourage to complete.  -Covid booster: Declines  -Tdap vaccine: Administered  -Hep B vaccine: May have had it -Hep C and HIV: Order  -Influenza vaccine: Administered  -PNA and Zoster: May obtain later  2.Physical exam completed today.  3.Continue all medications.   4.Continue a high protein diet and regular exercise. 5.Ordered labs. Please schedule a lab appointment when able to fast. Office will call with lab results and will be available via MyChart.  6.Ear lavage completed on both ears for ear wax. Successful. Return in about 6 months (around 12/05/2024) for chronic management.     Saleen Peden, NP

## 2024-06-07 NOTE — Patient Instructions (Signed)
-  It was great to see you today.  -Physical exam completed today.  -Tdap vaccine and influenza vaccine provided. -Please completed cologuard.  -Continue all medications.  -Continue a high protein diet and regular exercise. -Ordered labs. Please schedule a lab appointment when able to fast. Office will call with lab results and will be available via MyChart.  -Ear lavage completed on both ears for ear wax. Successful. -Follow up in 6 months.

## 2024-06-08 LAB — HIV ANTIBODY (ROUTINE TESTING W REFLEX)
HIV 1&2 Ab, 4th Generation: NONREACTIVE
HIV FINAL INTERPRETATION: NEGATIVE

## 2024-06-08 LAB — HEPATITIS C ANTIBODY: Hepatitis C Ab: NONREACTIVE

## 2024-06-08 LAB — COMPREHENSIVE METABOLIC PANEL WITH GFR
ALT: 18 U/L (ref 0–35)
AST: 19 U/L (ref 0–37)
Albumin: 4.2 g/dL (ref 3.5–5.2)
Alkaline Phosphatase: 69 U/L (ref 39–117)
BUN: 10 mg/dL (ref 6–23)
CO2: 30 meq/L (ref 19–32)
Calcium: 9.5 mg/dL (ref 8.4–10.5)
Chloride: 103 meq/L (ref 96–112)
Creatinine, Ser: 0.82 mg/dL (ref 0.40–1.20)
GFR: 82.94 mL/min (ref >60.00)
Glucose, Bld: 90 mg/dL (ref 70–99)
Potassium: 4 meq/L (ref 3.5–5.1)
Sodium: 140 meq/L (ref 135–145)
Total Bilirubin: 0.3 mg/dL (ref 0.2–1.2)
Total Protein: 7.9 g/dL (ref 6.0–8.3)

## 2024-06-08 LAB — LIPID PANEL
Cholesterol: 253 mg/dL — ABNORMAL HIGH (ref 0–200)
HDL: 57 mg/dL (ref >39.00)
LDL Cholesterol: 174 mg/dL — ABNORMAL HIGH (ref 0–99)
NonHDL: 195.57
Total CHOL/HDL Ratio: 4
Triglycerides: 108 mg/dL (ref 0.0–149.0)
VLDL: 21.6 mg/dL (ref 0.0–40.0)

## 2024-06-08 LAB — CBC WITH DIFFERENTIAL/PLATELET
Basophils Absolute: 0.1 K/uL (ref 0.0–0.1)
Basophils Relative: 1.2 % (ref 0.0–3.0)
Eosinophils Absolute: 0.7 K/uL (ref 0.0–0.7)
Eosinophils Relative: 9.9 % — ABNORMAL HIGH (ref 0.0–5.0)
HCT: 42.5 % (ref 36.0–46.0)
Hemoglobin: 14.4 g/dL (ref 12.0–15.0)
Lymphocytes Relative: 33.1 % (ref 12.0–46.0)
Lymphs Abs: 2.3 K/uL (ref 0.7–4.0)
MCHC: 33.8 g/dL (ref 30.0–36.0)
MCV: 88.5 fl (ref 78.0–100.0)
Monocytes Absolute: 0.5 K/uL (ref 0.1–1.0)
Monocytes Relative: 7 % (ref 3.0–12.0)
Neutro Abs: 3.3 K/uL (ref 1.4–7.7)
Neutrophils Relative %: 48.8 % (ref 43.0–77.0)
Platelets: 102 K/uL — ABNORMAL LOW (ref 150.0–400.0)
RBC: 4.8 Mil/uL (ref 3.87–5.11)
RDW: 14.2 % (ref 11.5–15.5)
WBC: 6.8 K/uL (ref 4.0–10.5)

## 2024-06-08 LAB — HEMOGLOBIN A1C: Hgb A1c MFr Bld: 5.6 % (ref 4.6–6.5)

## 2024-06-08 LAB — TSH: TSH: 1.84 u[IU]/mL (ref 0.35–5.50)

## 2024-06-08 LAB — VITAMIN B12: Vitamin B-12: 507 pg/mL (ref 211–911)

## 2024-06-08 LAB — VITAMIN D 25 HYDROXY (VIT D DEFICIENCY, FRACTURES): VITD: 36.61 ng/mL (ref 30.00–100.00)

## 2024-06-09 ENCOUNTER — Ambulatory Visit: Payer: Self-pay | Admitting: Family Medicine

## 2024-06-09 DIAGNOSIS — D696 Thrombocytopenia, unspecified: Secondary | ICD-10-CM

## 2024-06-15 ENCOUNTER — Ambulatory Visit: Admitting: Podiatry

## 2024-06-15 DIAGNOSIS — B351 Tinea unguium: Secondary | ICD-10-CM

## 2024-06-15 DIAGNOSIS — Z79899 Other long term (current) drug therapy: Secondary | ICD-10-CM | POA: Diagnosis not present

## 2024-06-15 MED ORDER — TERBINAFINE HCL 250 MG PO TABS: 250.0000 mg | ORAL_TABLET | Freq: Every day | ORAL | 0 refills | Status: AC

## 2024-06-15 NOTE — Progress Notes (Signed)
 Subjective:  Patient ID: Shelly Carlson, female    DOB: 09/12/1970,  MRN: [id number]  Chief Complaint  Patient presents with   Tinea Pedis    53 y.o. female presents with the above complaint.  Patient presents with thickened onychodystrophy mycotic toenails x 10 mild pain on palpation she wanted to discuss treatment options for nail fungus she has tried topical medication which has not helped.  She would wishes to discuss oral medication.   Review of Systems: Negative except as noted in the HPI. Denies N/V/F/Ch.  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 and vagal maneuvers instructions)   Hypertension    Leiomyoma    Right ovarian cyst    Wears glasses     Current Outpatient Medications:    terbinafine  (LAMISIL ) 250 MG tablet, Take 1 tablet (250 mg total) by mouth daily., Disp: 90 tablet, Rfl: 0   albuterol  (VENTOLIN  HFA) 108 (90 Base) MCG/ACT inhaler, Inhale 2 puffs into the lungs every 6 (six) hours as needed for wheezing or shortness of breath (Cough)., Disp: 18 g, Rfl: 2   Collagen-Vitamin C-Biotin (COLLAGEN PO), Take by mouth., Disp: , Rfl:    desloratadine  (CLARINEX ) 5 MG tablet, Take 1 tablet (5 mg total) by mouth daily., Disp: 30 tablet, Rfl: 2   metoprolol  succinate (TOPROL -XL) 50 MG 24 hr tablet, Take 1 tablet (50 mg total) by mouth at bedtime. Take with or immediately following a meal., Disp: 90 tablet, Rfl: 1   mometasone  (NASONEX ) 50 MCG/ACT nasal spray, Place 2 sprays into the nose daily., Disp: 1 each, Rfl: 2   Multiple Vitamins-Minerals (MULTIVITAMIN WITH MINERALS) tablet, Take 1 tablet by mouth daily., Disp: , Rfl:    promethazine -dextromethorphan (PROMETHAZINE -DM) 6.25-15 MG/5ML syrup, Take 5 mLs by mouth at bedtime as needed for cough., Disp: 60 mL, Rfl: 0  Social History   Tobacco Use  Smoking  Status Never   Passive exposure: Never  Smokeless Tobacco Never    Allergies  Allergen Reactions   Codeine Hives   Oxycodone Hives   Objective:  There were no vitals filed for this visit. There is no height or weight on file to calculate BMI. Constitutional Well developed. Well nourished.  Vascular Dorsalis pedis pulses palpable bilaterally. Posterior tibial pulses palpable bilaterally. Capillary refill normal to all digits.  No cyanosis or clubbing noted. Pedal hair growth normal.  Neurologic Normal speech. Oriented to person, place, and time. Epicritic sensation to light touch grossly present bilaterally.  Dermatologic Nails thickened elongated Shogry mycotic toenails x 10 mild pain on palpation of Skin within normal limits  Orthopedic: Normal joint ROM without pain or crepitus bilaterally. No visible deformities. No bony tenderness.   Radiographs: None Assessment:   1. Long-term use of high-risk medication   2. Nail fungus   3. Onychomycosis due to dermatophyte    Plan:  Patient was evaluated and treated and all questions answered.  Onychomycosis toenails x 10 -Educated the patient on the etiology of onychomycosis and various treatment options associated with improving the fungal load.  I explained to the patient that there is 3 treatment options available to treat the onychomycosis including topical, p.o., laser treatment.  Patient elected to undergo p.o. options with Lamisil /terbinafine  therapy.  In order for me to start the medication therapy, I explained to the patient  the importance of evaluating the liver and obtaining the liver function test.  Once the liver function test comes back normal I will start him on 32-month course of Lamisil  therapy.  Patient understood all risk and would like to proceed with Lamisil  therapy.  I have asked the patient to immediately stop the Lamisil  therapy if she has any reactions to it and call the office or go to the emergency room right  away.  Patient states understanding   No follow-ups on file.

## 2024-07-18 ENCOUNTER — Inpatient Hospital Stay: Admitting: Hematology and Oncology

## 2024-07-18 ENCOUNTER — Inpatient Hospital Stay

## 2024-07-26 ENCOUNTER — Inpatient Hospital Stay: Admitting: Hematology and Oncology

## 2024-07-26 ENCOUNTER — Inpatient Hospital Stay

## 2024-07-26 DIAGNOSIS — D696 Thrombocytopenia, unspecified: Secondary | ICD-10-CM | POA: Insufficient documentation

## 2024-07-26 NOTE — Assessment & Plan Note (Deleted)
 Lab review: 10/24/2013: Platelets 144 01/07/2018: Platelets 126 06/07/2024: Platelets 102  LFTs are normal. Mild thrombocytopenia: Platelet count 128 on 07/11/2016; 129 on 08/09/2016, 144 in February 2009 Remainder of the CBC with differential was normal, hemoglobin 15.9, WBC 4.8 with normal differential  Differential diagnosis: 1. Low-grade ITP 2. medication induced: On further review patient is not taking any medications that are associated with thrombocytopenia 3. Bone marrow factors 4. Hepatitis B/C 5. Splenomegaly: No enlarged spleen was palpable to physical exam. 6.  Platelet clumping: Spurious thrombocytopenia  I discussed with the patient that the level of thrombocytopenia is very mild and that these levels, there are usually no adverse effects. There is usually no risk of bleeding and hence it can be observed without making any changes to patient's medications or requiring any further investigations like bone marrow biopsies.  Recheck platelet counts today. We will also obtain immature platelet fraction  I will call Shelly Carlson with these results

## 2024-08-11 ENCOUNTER — Inpatient Hospital Stay: Attending: Hematology and Oncology | Admitting: Hematology and Oncology

## 2024-08-11 ENCOUNTER — Inpatient Hospital Stay

## 2024-08-11 VITALS — BP 137/79 | HR 67 | Temp 97.6°F | Resp 18 | Ht 65.0 in | Wt 229.0 lb

## 2024-08-11 DIAGNOSIS — D696 Thrombocytopenia, unspecified: Secondary | ICD-10-CM | POA: Insufficient documentation

## 2024-08-11 LAB — CBC WITH DIFFERENTIAL (CANCER CENTER ONLY)
Abs Immature Granulocytes: 0.01 K/uL (ref 0.00–0.07)
Basophils Absolute: 0 K/uL (ref 0.0–0.1)
Basophils Relative: 1 %
Eosinophils Absolute: 0.3 K/uL (ref 0.0–0.5)
Eosinophils Relative: 5 %
HCT: 40.5 % (ref 36.0–46.0)
Hemoglobin: 14.3 g/dL (ref 12.0–15.0)
Immature Granulocytes: 0 %
Lymphocytes Relative: 33 %
Lymphs Abs: 2.1 K/uL (ref 0.7–4.0)
MCH: 29.6 pg (ref 26.0–34.0)
MCHC: 35.3 g/dL (ref 30.0–36.0)
MCV: 83.9 fL (ref 80.0–100.0)
Monocytes Absolute: 0.4 K/uL (ref 0.1–1.0)
Monocytes Relative: 6 %
Neutro Abs: 3.4 K/uL (ref 1.7–7.7)
Neutrophils Relative %: 55 %
Platelet Count: 127 K/uL — ABNORMAL LOW (ref 150–400)
RBC: 4.83 MIL/uL (ref 3.87–5.11)
RDW: 13.2 % (ref 11.5–15.5)
WBC Count: 6.3 K/uL (ref 4.0–10.5)
nRBC: 0 % (ref 0.0–0.2)

## 2024-08-11 LAB — CMP (CANCER CENTER ONLY)
ALT: 18 U/L (ref 0–44)
AST: 22 U/L (ref 15–41)
Albumin: 4.5 g/dL (ref 3.5–5.0)
Alkaline Phosphatase: 75 U/L (ref 38–126)
Anion gap: 11 (ref 5–15)
BUN: 12 mg/dL (ref 6–20)
CO2: 24 mmol/L (ref 22–32)
Calcium: 9.6 mg/dL (ref 8.9–10.3)
Chloride: 105 mmol/L (ref 98–111)
Creatinine: 1.09 mg/dL — ABNORMAL HIGH (ref 0.44–1.00)
GFR, Estimated: >60 mL/min
Glucose, Bld: 106 mg/dL — ABNORMAL HIGH (ref 70–99)
Potassium: 4 mmol/L (ref 3.5–5.1)
Sodium: 140 mmol/L (ref 135–145)
Total Bilirubin: 0.5 mg/dL (ref 0.0–1.2)
Total Protein: 8.3 g/dL — ABNORMAL HIGH (ref 6.5–8.1)

## 2024-08-11 LAB — VITAMIN B12: Vitamin B-12: 477 pg/mL (ref 180–914)

## 2024-08-11 LAB — IMMATURE PLATELET FRACTION: Immature Platelet Fraction: 21.8 % — ABNORMAL HIGH (ref 1.2–8.6)

## 2024-08-11 NOTE — Assessment & Plan Note (Signed)
 Lab review: 10/24/2013: Platelets 144 01/07/2018: Platelets 126 06/07/2024: Platelets 102  Differential diagnosis: 1. Low-grade ITP 2. medication induced: On further review patient is not taking any medications that are associated with thrombocytopenia 3. Bone marrow factors 4. Hepatitis B/C 5. Splenomegaly: Will obtain ultrasound  I discussed with the patient that the level of thrombocytopenia is very mild and that these levels, there are usually no adverse effects. There is usually no risk of bleeding and hence it can be observed without making any changes to patient's medications or requiring any further investigations like bone marrow biopsies.  I recommended watchful monitoring. We can see the patient back in one year with labs and follow-up.

## 2024-08-11 NOTE — Progress Notes (Signed)
 [facility] CONSULT NOTE  Patient Care Team: Billy Philippe SAUNDERS, NP as PCP - General (Family Medicine) Ginette Shasta NOVAK, NP as Nurse Practitioner (Obstetrics and Gynecology)  CHIEF COMPLAINTS/PURPOSE OF CONSULTATION: Consultation for thrombocytopenia  HISTORY OF PRESENTING ILLNESS:   History of Present Illness Shelly Carlson is a 54 year old woman who presents for evaluation of chronic, progressive thrombocytopenia.  Thrombocytopenia was first noted about ten years ago with platelets 146 x10^9/L and has gradually declined to 106 x10^9/L without prior workup or treatment.  She has no significant comorbidities aside from a myomectomy in 2019 and takes no medications for thrombocytopenia. Alcohol intake is minimal, about one glass of wine per month, usually not finished. She has not received the hepatitis B vaccine but did receive TDAP.    I reviewed her records extensively and collaborated the history with the patient.   MEDICAL HISTORY:  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 and vagal maneuvers instructions)   Hypertension    Leiomyoma    Right ovarian cyst    Wears glasses     SURGICAL HISTORY: Past Surgical History:  Procedure Laterality Date   MYOMECTOMY N/A 01/25/2018   Procedure: ABDOMINAL MYOMECTOMY;  Surgeon: Rockney Evalene SQUIBB, MD;  Location: [facility];  Service: Gynecology;  Laterality: N/A;   OVARIAN CYST REMOVAL Right 01/25/2018   Procedure: OVARIAN CYSTECTOMY;  Surgeon: Rockney Evalene SQUIBB, MD;  Location:  SURGERY CENTER;  Service: Gynecology;  Laterality: Right;    SOCIAL HISTORY: Social History   Socioeconomic History   Marital status: Divorced    Spouse name: Not on file   Number of children: 0   Years of education: Not on file    Highest education level: Master's degree (e.g., MA, MS, MEng, MEd, MSW, MBA)  Occupational History   Not on file  Tobacco Use   Smoking status: Never    Passive exposure: Never   Smokeless tobacco: Never  Vaping Use   Vaping status: Never Used  Substance and Sexual Activity   Alcohol use: Yes    Comment: Once a month   Drug use: No   Sexual activity: Not Currently    Partners: Male    Birth control/protection: None    Comment: menarche 54yo, 1st intercourse 53 yo  Other Topics Concern   Not on file  Social History Narrative   Not on file   Social Drivers of Health   Tobacco Use: Low Risk (06/07/2024)   Patient History    Smoking Tobacco Use: Never    Smokeless Tobacco Use: Never    Passive Exposure: Never  Financial Resource Strain: Low Risk (06/04/2024)   Overall Financial Resource Strain (CARDIA)    Difficulty of Paying Living Expenses: Not hard at all  Food Insecurity: No Food Insecurity (08/10/2024)   Epic    Worried About Radiation Protection Practitioner of Food in the Last Year: Never true    Ran Out of Food in the Last Year: Never true  Transportation Needs: No Transportation Needs (08/10/2024)   Epic    Lack of Transportation (Medical): No    Lack of Transportation (Non-Medical): No  Physical Activity: Insufficiently Active (06/04/2024)   Exercise Vital Sign    Days of Exercise per Week: 3 days    Minutes of Exercise per Session: 40  min  Stress: No Stress Concern Present (06/04/2024)   [employer] of Occupational Health - Occupational Stress Questionnaire    Feeling of Stress: Only a little  Social Connections: Moderately Integrated (06/04/2024)   Social Connection and Isolation Panel    Frequency of Communication with Friends and Family: More than three times a week    Frequency of Social Gatherings with Friends and Family: More than three times a week    Attends Religious Services: More than 4 times per year    Active Member of Clubs or Organizations: Yes    Attends Occupational Hygienist Meetings: 1 to 4 times per year    Marital Status: Divorced  Intimate Partner Violence: Not At Risk (03/07/2024)   Epic    Fear of Current or Ex-Partner: No    Emotionally Abused: No    Physically Abused: No    Sexually Abused: No  Depression (PHQ2-9): Low Risk (06/07/2024)   Depression (PHQ2-9)    PHQ-2 Score: 0  Alcohol Screen: Low Risk (06/04/2024)   Alcohol Screen    Last Alcohol Screening Score (AUDIT): 1  Housing: Low Risk (08/10/2024)   Epic    Unable to Pay for Housing in the Last Year: No    Number of Times Moved in the Last Year: 0    Homeless in the Last Year: No  Utilities: Not At Risk (08/10/2024)   Epic    Threatened with loss of utilities: No  Health Literacy: Adequate Health Literacy (03/07/2024)   B1300 Health Literacy    Frequency of need for help with medical instructions: Never    FAMILY HISTORY: Family History  Problem Relation Age of Onset   Hypertension Mother    Diabetes Mother    Pneumonia Mother    Atrial fibrillation Mother    Lung disease Mother    ALS Father    Healthy Brother    High Cholesterol Maternal Grandmother    Asthma Maternal Grandmother    Healthy Maternal Grandfather    Heart attack Paternal Grandmother    Arthritis Paternal Grandmother    Alcohol abuse Paternal Grandfather     ALLERGIES:  is allergic to codeine and oxycodone.  MEDICATIONS:  Current Outpatient Medications  Medication Sig Dispense Refill   albuterol  (VENTOLIN  HFA) 108 (90 Base) MCG/ACT inhaler Inhale 2 puffs into the lungs every 6 (six) hours as needed for wheezing or shortness of breath (Cough). 18 g 2   Collagen-Vitamin C-Biotin (COLLAGEN PO) Take by mouth.     desloratadine  (CLARINEX ) 5 MG tablet Take 1 tablet (5 mg total) by mouth daily. 30 tablet 2   metoprolol  succinate (TOPROL -XL) 50 MG 24 hr tablet Take 1 tablet (50 mg total) by mouth at bedtime. Take with or immediately following a meal. 90 tablet 1   mometasone  (NASONEX ) 50 MCG/ACT  nasal spray Place 2 sprays into the nose daily. 1 each 2   Multiple Vitamins-Minerals (MULTIVITAMIN WITH MINERALS) tablet Take 1 tablet by mouth daily.     promethazine -dextromethorphan (PROMETHAZINE -DM) 6.25-15 MG/5ML syrup Take 5 mLs by mouth at bedtime as needed for cough. 60 mL 0   terbinafine  (LAMISIL ) 250 MG tablet Take 1 tablet (250 mg total) by mouth daily. 90 tablet 0   No current facility-administered medications for this visit.    REVIEW OF SYSTEMS:   Constitutional: Denies fevers, chills or abnormal night sweats All other systems were reviewed with the patient and are negative.  PHYSICAL EXAMINATION: ECOG PERFORMANCE STATUS: 1 - Symptomatic but completely ambulatory  Vitals:   08/11/24 1252  BP: 137/79  Pulse: 67  Resp: 18  Temp: 97.6 F (36.4 C)  SpO2: 99%   Filed Weights   08/11/24 1252  Weight: 229 lb (103.9 kg)    GENERAL:alert, no distress and comfortable  LABORATORY DATA:  I have reviewed the data as listed Lab Results  Component Value Date   WBC 6.8 06/07/2024   HGB 14.4 06/07/2024   HCT 42.5 06/07/2024   MCV 88.5 06/07/2024   PLT 102.0 (L) 06/07/2024   Lab Results  Component Value Date   NA 140 06/07/2024   K 4.0 06/07/2024   CL 103 06/07/2024   CO2 30 06/07/2024    RADIOGRAPHIC STUDIES: I have personally reviewed the radiological reports and agreed with the findings in the report.  ASSESSMENT AND PLAN:  Thrombocytopenia Lab review: 10/24/2013: Platelets 144 01/07/2018: Platelets 126 06/07/2024: Platelets 102  Differential diagnosis: 1. Low-grade ITP 2. medication induced: On further review patient is not taking any medications that are associated with thrombocytopenia 3. Bone marrow factors 4. Hepatitis B/C 5. Splenomegaly: Will obtain ultrasound  I discussed with the patient that the level of thrombocytopenia is very mild and that these levels, there are usually no adverse effects. There is usually no risk of bleeding and hence it  can be observed without making any changes to patient's medications or requiring any further investigations like bone marrow biopsies.  Telephone visit in 2 to 3 weeks to discuss results   All questions were answered. The patient knows to call the clinic with any problems, questions or concerns.  I personally spent a total of 60 minutes in the care of the patient today including preparing to see the patient, getting/reviewing separately obtained history, performing a medically appropriate exam/evaluation, counseling and educating, placing orders, referring and communicating with other health care professionals, documenting clinical information in the EHR, independently interpreting results, communicating results, and coordinating care.    Viinay K Joshawa Dubin, MD 08/11/24

## 2024-08-12 LAB — MISC LABCORP TEST (SEND OUT)
Labcorp test code: 144050
Labcorp test code: 6510

## 2024-08-22 ENCOUNTER — Ambulatory Visit (HOSPITAL_COMMUNITY)

## 2024-09-05 ENCOUNTER — Ambulatory Visit (HOSPITAL_COMMUNITY)

## 2024-10-14 ENCOUNTER — Ambulatory Visit: Admitting: Podiatry
